# Patient Record
Sex: Female | Born: 1965 | Race: White | Hispanic: No | Marital: Married | Smoking: Never smoker
Health system: Southern US, Community
[De-identification: ages and names within clinical notes are randomized; demographics above are authoritative.]

## PROBLEM LIST (undated history)

## (undated) DIAGNOSIS — R928 Other abnormal and inconclusive findings on diagnostic imaging of breast: Secondary | ICD-10-CM

## (undated) DIAGNOSIS — R002 Palpitations: Secondary | ICD-10-CM

## (undated) DIAGNOSIS — F419 Anxiety disorder, unspecified: Secondary | ICD-10-CM

## (undated) DIAGNOSIS — N189 Chronic kidney disease, unspecified: Secondary | ICD-10-CM

## (undated) HISTORY — DX: Other abnormal and inconclusive findings on diagnostic imaging of breast: R92.8

## (undated) HISTORY — DX: Anxiety disorder, unspecified: F41.9

## (undated) HISTORY — PX: TONSILLECTOMY: SUR1361

## (undated) HISTORY — DX: Palpitations: R00.2

---

## 2001-09-26 HISTORY — PX: AUGMENTATION MAMMAPLASTY: SUR837

## 2004-08-11 ENCOUNTER — Ambulatory Visit: Payer: Self-pay

## 2006-01-10 ENCOUNTER — Ambulatory Visit: Payer: Self-pay

## 2007-01-12 ENCOUNTER — Ambulatory Visit: Payer: Self-pay

## 2007-01-16 ENCOUNTER — Ambulatory Visit: Payer: Self-pay | Admitting: Unknown Physician Specialty

## 2007-12-02 ENCOUNTER — Ambulatory Visit: Payer: Self-pay | Admitting: Family Medicine

## 2008-01-14 ENCOUNTER — Ambulatory Visit: Payer: Self-pay

## 2009-01-14 ENCOUNTER — Ambulatory Visit: Payer: Self-pay

## 2009-03-05 ENCOUNTER — Ambulatory Visit: Payer: Self-pay | Admitting: Internal Medicine

## 2009-03-25 ENCOUNTER — Ambulatory Visit: Payer: Self-pay | Admitting: Internal Medicine

## 2010-01-25 ENCOUNTER — Ambulatory Visit: Payer: Self-pay

## 2011-02-08 ENCOUNTER — Ambulatory Visit: Payer: Self-pay

## 2012-02-14 ENCOUNTER — Ambulatory Visit: Payer: Self-pay

## 2015-03-12 ENCOUNTER — Ambulatory Visit: Payer: Self-pay

## 2015-04-21 ENCOUNTER — Other Ambulatory Visit: Payer: Self-pay | Admitting: Certified Nurse Midwife

## 2015-04-21 DIAGNOSIS — R928 Other abnormal and inconclusive findings on diagnostic imaging of breast: Secondary | ICD-10-CM

## 2015-04-24 ENCOUNTER — Ambulatory Visit
Admission: RE | Admit: 2015-04-24 | Discharge: 2015-04-24 | Disposition: A | Discharge status: Home / Self Care | Payer: BC Managed Care – PPO | Source: Ambulatory Visit | Attending: Certified Nurse Midwife | Admitting: Certified Nurse Midwife

## 2015-04-24 DIAGNOSIS — N6001 Solitary cyst of right breast: Secondary | ICD-10-CM | POA: Insufficient documentation

## 2015-04-24 DIAGNOSIS — R928 Other abnormal and inconclusive findings on diagnostic imaging of breast: Secondary | ICD-10-CM

## 2015-04-27 ENCOUNTER — Ambulatory Visit: Payer: Self-pay

## 2015-04-27 ENCOUNTER — Other Ambulatory Visit: Payer: Self-pay

## 2015-05-11 ENCOUNTER — Encounter: Payer: Self-pay | Admitting: Urology

## 2015-05-11 ENCOUNTER — Ambulatory Visit (INDEPENDENT_AMBULATORY_CARE_PROVIDER_SITE_OTHER): Payer: BC Managed Care – PPO | Admitting: Urology

## 2015-05-11 VITALS — BP 121/78 | HR 71 | Ht 67.0 in | Wt 152.4 lb

## 2015-05-11 DIAGNOSIS — R310 Gross hematuria: Secondary | ICD-10-CM | POA: Diagnosis not present

## 2015-05-11 DIAGNOSIS — M545 Low back pain, unspecified: Secondary | ICD-10-CM

## 2015-05-11 LAB — MICROSCOPIC EXAMINATION: WBC, UA: NONE SEEN /hpf (ref 0–?)

## 2015-05-11 LAB — URINALYSIS, COMPLETE
BILIRUBIN UA: NEGATIVE
Glucose, UA: NEGATIVE
Ketones, UA: NEGATIVE
Leukocytes, UA: NEGATIVE
NITRITE UA: NEGATIVE
PH UA: 5.5 (ref 5.0–7.5)
PROTEIN UA: NEGATIVE
Specific Gravity, UA: >1.03 — ABNORMAL HIGH (ref 1.005–1.030)
UUROB: 0.2 mg/dL (ref 0.2–1.0)

## 2015-05-11 NOTE — Progress Notes (Signed)
05/11/2015 3:16 PM   Claire Ramos [birth year] [id number]  Referring provider: No referring provider defined for this encounter.  Chief Complaint  Patient presents with  . Hematuria    new pt- gross hematuria    HPI: Claire Ramos is a 49 year old white female who presents today complaining of gross hematuria.  Patient states about 2 months ago she had noticed bloody stringy discharge on her toilet tissue.   She has also been experiencing lower back pain. She states the pain comes and goes. She states that lasts more than half the days. It is worse in the a.m. but diminishes as the day goes on. She is taking 2 Advil at a time and it somewhat eases the pain.  She describes the pain as an knife pressing down into her back.   She has not had any fevers, chills, nausea or vomiting. She states she does have a history of kidney stones, but she never captured a fragment.   Her UA today contains 11-30 RBC's per high-power field. She has not experienced any more gross hematuria. She is also not experiencing any vaginal discharge or vaginal discomfort.   PMH: Past Medical History  Diagnosis Date  . Anxiety     Surgical History: Past Surgical History  Procedure Laterality Date  . Augmentation mammaplasty Bilateral 2003    Home Medications:    Medication List       This list is accurate as of: 05/11/15 11:59 PM.  Always use your most recent med list.               escitalopram 10 MG tablet  Commonly known as:  LEXAPRO  Take by mouth.     fluticasone 50 MCG/ACT nasal spray  Commonly known as:  FLONASE  Place into the nose.     ibuprofen 200 MG tablet  Commonly known as:  ADVIL,MOTRIN  Take 200 mg by mouth every 6 (six) hours as needed.        Allergies: No Known Allergies  Family History: Family History  Problem Relation Age of Onset  . Kidney cancer Neg Hx   . Kidney disease Neg Hx   . Prostate cancer Neg Hx     Social History:  reports that she has  never smoked. She does not have any smokeless tobacco history on file. She reports that she drinks alcohol. She reports that she does not use illicit drugs.  ROS: UROLOGY Frequent Urination?: No Hard to postpone urination?: No Burning/pain with urination?: No Get up at night to urinate?: No Leakage of urine?: Yes Urine stream starts and stops?: No Trouble starting stream?: No Do you have to strain to urinate?: No Blood in urine?: Yes Urinary tract infection?: No Sexually transmitted disease?: No Injury to kidneys or bladder?: No Painful intercourse?: No Weak stream?: No Currently pregnant?: No Vaginal bleeding?: No Last menstrual period?: n  Gastrointestinal Nausea?: No Vomiting?: No Indigestion/heartburn?: No Diarrhea?: No Constipation?: No  Constitutional Fever: No Night sweats?: No Weight loss?: No Fatigue?: No  Skin Skin rash/lesions?: No Itching?: No  Eyes Blurred vision?: No Double vision?: No  Ears/Nose/Throat Sore throat?: No Sinus problems?: No  Hematologic/Lymphatic Swollen glands?: No Easy bruising?: No  Cardiovascular Leg swelling?: No Chest pain?: No  Respiratory Cough?: No Shortness of breath?: No  Endocrine Excessive thirst?: No  Musculoskeletal Back pain?: Yes Joint pain?: No  Neurological Headaches?: Yes Dizziness?: No  Psychologic Depression?: No Anxiety?: Yes  Physical Exam: BP 121/78 mmHg  Pulse 71  Ht 5\' 7"  (1.702 m)  Wt 152 lb 6.4 oz (69.128 kg)  BMI 23.86 kg/m2  Constitutional:  Alert and oriented, No acute distress. HEENT: [place] AT, moist mucus membranes.  Trachea midline, no masses. Cardiovascular: No clubbing, cyanosis, or edema. Respiratory: Normal respiratory effort, no increased work of breathing. GI: Abdomen is soft, non tender, non distended, no abdominal masses GU: No CVA tenderness. Normal external genitalia.  Normal urethral meatus. No urethral masses and/or tenderness. Urethral hypermobility upon  straining, no leakage noted. No bladder fullness or masses. No vaginal lesions or discharge. Normal rectal tone, no masses. Normal anus and perineum.  Skin: No rashes, bruises or suspicious lesions. Lymph: No cervical or inguinal adenopathy. Neurologic: Grossly intact, no focal deficits, moving all 4 extremities. Psychiatric: Normal mood and affect.  Laboratory Data: Results for orders placed or performed in visit on 05/11/15  Microscopic Examination  Result Value Ref Range   WBC, UA None seen 0 -  5 /hpf   RBC, UA 11-30 (A) 0 -  2 /hpf   Epithelial Cells (non renal) >10E 0 - 10 /hpf   Bacteria, UA Few None seen/Few  Urinalysis, Complete  Result Value Ref Range   Specific Gravity, UA >1.030 (H) 1.005 - 1.030   pH, UA 5.5 5.0 - 7.5   Color, UA Yellow Yellow   Appearance Ur Clear Clear   Leukocytes, UA Negative Negative   Protein, UA Negative Negative/Trace   Glucose, UA Negative Negative   Ketones, UA Negative Negative   RBC, UA 3+ (A) Negative   Bilirubin, UA Negative Negative   Urobilinogen, Ur 0.2 0.2 - 1.0 mg/dL   Nitrite, UA Negative Negative   Microscopic Examination See below:     Pertinent Imaging:   Assessment & Plan:    1. Gross hematuria:   The patient described a bloody discharge which sounds vaginal in nature, but I could not demonstrate vaginal bleeding on her exam today and her UA was positive for microscopic hematuria.   Because of her persistent lower back pain, possible episode of gross hematuria and microhematuria on today's exam, I think it is reasonable to pursue a hematuria workup.   I explained to patient the causes of blood in the urine are as follows: stones,  UTI's, damage to the urinary tract and/or cancer.  It is explained to the patient that they will be scheduled for a CT Urogram with contrast material and that in rare instances, an allergic reaction can be serious and even life threatening with the injection of contrast material.   The patient  denies any allergies to contrast, iodine and/or seafood and is not taking metformin.  - Urinalysis, Complete   2. Acute low back pain:    Patient has been experiencing lower back pain for the last 2 months associated with hematuria. She will undergo a CT urogram for further evaluation. She will continue with conservative measures for her back pain at this time.  No Follow-up on file.  Zara Council, [facility] [address] [place], [place] [address] [phone]

## 2015-05-17 DIAGNOSIS — M545 Low back pain, unspecified: Secondary | ICD-10-CM | POA: Insufficient documentation

## 2015-05-17 DIAGNOSIS — R310 Gross hematuria: Secondary | ICD-10-CM | POA: Insufficient documentation

## 2015-06-04 ENCOUNTER — Ambulatory Visit: Admission: RE | Admit: 2015-06-04 | Payer: BC Managed Care – PPO | Source: Ambulatory Visit

## 2015-06-04 ENCOUNTER — Ambulatory Visit
Admission: RE | Admit: 2015-06-04 | Discharge: 2015-06-04 | Disposition: A | Discharge status: Home / Self Care | Payer: BC Managed Care – PPO | Source: Ambulatory Visit | Attending: Urology | Admitting: Urology

## 2015-06-04 DIAGNOSIS — N200 Calculus of kidney: Secondary | ICD-10-CM | POA: Diagnosis not present

## 2015-06-04 DIAGNOSIS — R310 Gross hematuria: Secondary | ICD-10-CM | POA: Diagnosis present

## 2015-06-04 MED ORDER — IOHEXOL 300 MG/ML  SOLN
125.0000 mL | Freq: Once | INTRAMUSCULAR | Status: AC | PRN
  Administered 2015-06-04: 125 mL via INTRAVENOUS

## 2015-06-08 ENCOUNTER — Ambulatory Visit: Payer: BC Managed Care – PPO | Admitting: Urology

## 2015-06-08 ENCOUNTER — Ambulatory Visit (INDEPENDENT_AMBULATORY_CARE_PROVIDER_SITE_OTHER): Payer: BC Managed Care – PPO | Admitting: Urology

## 2015-06-08 VITALS — BP 121/80 | HR 75 | Ht 67.0 in | Wt 151.8 lb

## 2015-06-08 DIAGNOSIS — R310 Gross hematuria: Secondary | ICD-10-CM | POA: Diagnosis not present

## 2015-06-08 LAB — URINALYSIS, COMPLETE
Bilirubin, UA: NEGATIVE
GLUCOSE, UA: NEGATIVE
KETONES UA: NEGATIVE
LEUKOCYTES UA: NEGATIVE
Nitrite, UA: NEGATIVE
Protein, UA: NEGATIVE
SPEC GRAV UA: 1.02 (ref 1.005–1.030)
Urobilinogen, Ur: 0.2 mg/dL (ref 0.2–1.0)
pH, UA: 5.5 (ref 5.0–7.5)

## 2015-06-08 LAB — MICROSCOPIC EXAMINATION: BACTERIA UA: NONE SEEN

## 2015-06-08 NOTE — Progress Notes (Addendum)
06/08/2015 9:44 PM   Claire Ramos 09-14-1966 [id number]  Referring provider: No referring provider defined for this encounter.  Chief Complaint  Patient presents with  . Results    CT    HPI: Patient is a 49 year old white female with a history of gross hematuria and lower back pain who underwent CT urogram for further evaluation of the hematuria and back pain.  I have reviewed the film with the patient. It demonstrated a nonobstructing 4 mm inter-polar left renal stone.  Unfortunately, there was not complete opacification of the right ureter on the imaging study.  I explained to the patient the significance of this finding.  Today, she is still experiencing urinary urgency  and stress urinary incontinence.  These are long time symptoms to her and not bothersome at this time. She has not had any further gross hematuria.  She still has 3-10 RBC's per high-power field.  She does not have a smoking history.  She has no family history of GU malignancies.  PMH: Past Medical History  Diagnosis Date  . Anxiety     Surgical History: Past Surgical History  Procedure Laterality Date  . Augmentation mammaplasty Bilateral 2003    Home Medications:    Medication List       This list is accurate as of: 06/08/15 11:59 PM.  Always use your most recent med list.               escitalopram 10 MG tablet  Commonly known as:  LEXAPRO  Take by mouth.     fluticasone 50 MCG/ACT nasal spray  Commonly known as:  FLONASE  Place into the nose.     ibuprofen 200 MG tablet  Commonly known as:  ADVIL,MOTRIN  Take 200 mg by mouth every 6 (six) hours as needed.        Allergies: No Known Allergies  Family History: Family History  Problem Relation Age of Onset  . Kidney cancer Neg Hx   . Kidney disease Neg Hx   . Prostate cancer Neg Hx     Social History:  reports that she has never smoked. She does not have any smokeless tobacco history on file. She reports that she drinks  alcohol. She reports that she does not use illicit drugs.  ROS: UROLOGY Frequent Urination?: No Hard to postpone urination?: Yes Burning/pain with urination?: No Get up at night to urinate?: No Leakage of urine?: Yes Urine stream starts and stops?: No Trouble starting stream?: No Do you have to strain to urinate?: No Blood in urine?: No Urinary tract infection?: No Sexually transmitted disease?: No Injury to kidneys or bladder?: No Painful intercourse?: No Weak stream?: No Currently pregnant?: No Vaginal bleeding?: No Last menstrual period?: IUD  Gastrointestinal Nausea?: No Vomiting?: No Indigestion/heartburn?: No Diarrhea?: No Constipation?: No  Constitutional Fever: No Night sweats?: No Weight loss?: No Fatigue?: No  Skin Skin rash/lesions?: No Itching?: No  Eyes Blurred vision?: No Double vision?: No  Ears/Nose/Throat Sore throat?: No Sinus problems?: No  Hematologic/Lymphatic Swollen glands?: No Easy bruising?: No  Cardiovascular Leg swelling?: No Chest pain?: No  Respiratory Cough?: No Shortness of breath?: No  Endocrine Excessive thirst?: No  Musculoskeletal Back pain?: Yes Joint pain?: No  Neurological Headaches?: No Dizziness?: No  Psychologic Depression?: No Anxiety?: No  Physical Exam: BP 121/80 mmHg  Pulse 75  Ht 5\' 7"  (1.702 m)  Wt 151 lb 12.8 oz (68.856 kg)  BMI 23.77 kg/m2  LMP  (LMP Unknown)  Constitutional:  Alert and oriented, No acute distress. HEENT: [place] AT, moist mucus membranes.  Trachea midline, no masses. Cardiovascular: No clubbing, cyanosis, or edema. Respiratory: Normal respiratory effort, no increased work of breathing. GI: Abdomen is soft, nontender, nondistended, no abdominal masses GU: No CVA tenderness.  Skin: No rashes, bruises or suspicious lesions. Lymph: No cervical or inguinal adenopathy. Neurologic: Grossly intact, no focal deficits, moving all 4 extremities. Psychiatric: Normal mood and  affect.  Laboratory Data: Urinalysis: Results for orders placed or performed in visit on 06/08/15  CULTURE, URINE COMPREHENSIVE  Result Value Ref Range   Urine Culture, Comprehensive Final report    Result 1 Comment   Microscopic Examination  Result Value Ref Range   WBC, UA 0-5 0 -  5 /hpf   RBC, UA 3-10 (A) 0 -  2 /hpf   Epithelial Cells (non renal) 0-10 0 - 10 /hpf   Mucus, UA Present (A) Not Estab.   Bacteria, UA None seen None seen/Few  Urinalysis, Complete  Result Value Ref Range   Specific Gravity, UA 1.020 1.005 - 1.030   pH, UA 5.5 5.0 - 7.5   Color, UA Yellow Yellow   Appearance Ur Clear Clear   Leukocytes, UA Negative Negative   Protein, UA Negative Negative/Trace   Glucose, UA Negative Negative   Ketones, UA Negative Negative   RBC, UA 3+ (A) Negative   Bilirubin, UA Negative Negative   Urobilinogen, Ur 0.2 0.2 - 1.0 mg/dL   Nitrite, UA Negative Negative   Microscopic Examination See below:     Pertinent Imaging: CLINICAL DATA: 49 year old female with gross hematuria and low back pain since June 2016.  EXAM: CT ABDOMEN AND PELVIS WITHOUT AND WITH CONTRAST  TECHNIQUE: Multidetector CT imaging of the abdomen and pelvis was performed following the standard protocol before and following the bolus administration of intravenous contrast.  CONTRAST: 135mL OMNIPAQUE IOHEXOL 300 MG/ML SOLN  COMPARISON: 03/05/2009 abdominal sonogram. Report from 03/25/2009 CT abdomen/pelvis (images not available).  FINDINGS: Lower chest: No significant pulmonary nodules or acute consolidative airspace disease at the visualized lung bases.  Hepatobiliary: Normal liver, with no liver mass. Normal gallbladder, with no radiopaque cholelithiasis. No biliary ductal dilatation.  Pancreas: Normal.  Spleen: Normal.  Adrenals/Urinary Tract: Normal adrenals. Nonobstructing 4 mm interpolar left renal stone. No additional renal stones. No hydronephrosis. Normal size  kidneys with symmetric contrast nephrogram is a no renal masses. Normal caliber ureters, with no ureteral stones. On delayed imaging, there is no urothelial wall thickening and there are no filling defects in the opacified portions of the bilateral collecting systems or ureters. Normal urinary bladder, with no bladder wall thickening, bladder masses, bladder stones or bladder diverticula.  Stomach/Bowel: Normal stomach. Normal caliber small bowel, with no small bowel wall thickening. Normal appendix. Normal large bowel, with no large bowel wall thickening and no colonic diverticulosis.  Vascular/Lymphatic: Normal caliber abdominal aorta. Patent portal, splenic, hepatic and renal veins. No abdominopelvic lymphadenopathy.  Reproductive: Normal anteverted uterus with grossly well-positioned intrauterine device in the endometrial cavity. Simple 1.3 cm right ovarian cyst, in keeping with a physiologic right ovarian follicle. No suspicious adnexal masses.  Other: No pneumoperitoneum, ascites or focal fluid collection.  Musculoskeletal: Limbus vertebra in the anterior superior L4 vertebral body. No aggressive appearing focal osseous lesions.  IMPRESSION: 1. Nonobstructing 4 mm interpolar left renal stone. 2. Otherwise normal urinary tract, with no renal masses and no urothelial lesions.   Electronically Signed  By: Ilona Sorrel M.D.  On: 06/04/2015 09:02  Assessment & Plan:    1. Gross hematuria:  Patient has completed her CT urogram.  A nonobstructing 4 mm interpolar left renal stone was discovered.  She is no longer experiencing gross hematuria, but continues to have microscopic hematuria.  She also continues to have lower back pain.  Her right ureter did not completely opacify on the CT urogram.  This causes some concern for an undiscovered ureteral lesion.  She will undergo cystoscopy with bilateral retrogrades in the OR to complete her CT urogram workup.  I described to  the patient how the procedure is performed and the risk associated with the procedure, such as: Infection, bleeding, uncomfortableness for the first few days after the procedure, the possibility of a biopsy of an area of concern in the ureters or bladder and the possibility of a stent placement.  I also explained the risks of general anesthesia, such as: MI, CVA, paralysis, coma and/or death.  I have discussed the case with Dr. Junious Silk and he agrees with the plan and will be performing the procedure.  2. Incomplete opacification of the right ureter on CT Urogram:   Proceed with cystoscopy with bilateral retrogrades.     Return for cystoscopy with bilateral retrogrades.  Zara Council, [facility] [address] [place], [place] [address] [phone]

## 2015-06-10 ENCOUNTER — Encounter: Payer: Self-pay | Admitting: Urology

## 2015-06-10 ENCOUNTER — Other Ambulatory Visit: Payer: BC Managed Care – PPO

## 2015-06-10 ENCOUNTER — Telehealth: Payer: Self-pay | Admitting: Radiology

## 2015-06-10 LAB — CULTURE, URINE COMPREHENSIVE

## 2015-06-10 NOTE — Telephone Encounter (Signed)
Notified pt of surgery date of 06/15/15 and pre-admit phone interview of 06/10/15. Pt voiced understanding.

## 2015-06-11 ENCOUNTER — Encounter
Admission: RE | Admit: 2015-06-11 | Discharge: 2015-06-11 | Disposition: A | Discharge status: Home / Self Care | Payer: BC Managed Care – PPO | Source: Ambulatory Visit | Attending: Urology | Admitting: Urology

## 2015-06-11 ENCOUNTER — Encounter: Payer: Self-pay | Admitting: *Deleted

## 2015-06-11 DIAGNOSIS — Z01812 Encounter for preprocedural laboratory examination: Secondary | ICD-10-CM | POA: Insufficient documentation

## 2015-06-11 LAB — BASIC METABOLIC PANEL
ANION GAP: 7 (ref 5–15)
BUN: 19 mg/dL (ref 6–20)
CALCIUM: 8.9 mg/dL (ref 8.9–10.3)
CO2: 29 mmol/L (ref 22–32)
Chloride: 102 mmol/L (ref 101–111)
Creatinine, Ser: 0.79 mg/dL (ref 0.44–1.00)
GFR calc Af Amer: >60 mL/min (ref >60)
Glucose, Bld: 75 mg/dL (ref 65–99)
POTASSIUM: 3.9 mmol/L (ref 3.5–5.1)
SODIUM: 138 mmol/L (ref 135–145)

## 2015-06-11 LAB — CBC
HEMATOCRIT: 40.6 % (ref 35.0–47.0)
HEMOGLOBIN: 13.1 g/dL (ref 12.0–16.0)
MCH: 27 pg (ref 26.0–34.0)
MCHC: 32.3 g/dL (ref 32.0–36.0)
MCV: 83.5 fL (ref 80.0–100.0)
Platelets: 292 10*3/uL (ref 150–440)
RBC: 4.86 MIL/uL (ref 3.80–5.20)
RDW: 13.9 % (ref 11.5–14.5)
WBC: 8.4 10*3/uL (ref 3.6–11.0)

## 2015-06-11 NOTE — Patient Instructions (Signed)
  Your procedure is scheduled on: 06-15-15 Report to [facility] To find out your arrival time please call [phone] between 1PM - 3PM 06-12-15  Remember: Instructions that are not followed completely may result in serious medical risk, up to and including death, or upon the discretion of your surgeon and anesthesiologist your surgery may need to be rescheduled.    _X___ 1. Do not eat food or drink liquids after midnight. No gum chewing or hard candies.     _X___ 2. No Alcohol for 24 hours before or after surgery.   ____ 3. Bring all medications with you on the day of surgery if instructed.    _X___ 4. Notify your doctor if there is any change in your medical condition     (cold, fever, infections).     Do not wear jewelry, make-up, hairpins, clips or nail polish.  Do not wear lotions, powders, or perfumes. You may wear deodorant.  Do not shave 48 hours prior to surgery. Men may shave face and neck.  Do not bring valuables to the hospital.    [facility] is not responsible for any belongings or valuables.               Contacts, dentures or bridgework may not be worn into surgery.  Leave your suitcase in the car. After surgery it may be brought to your room.  For patients admitted to the hospital, discharge time is determined by your  treatment team.   Patients discharged the day of surgery will not be allowed to drive home.   Please read over the following fact sheets that you were given:   ____ Take these medicines the morning of surgery with A SIP OF WATER:    1. NONE  2.   3.   4.  5.  6.  ____ Fleet Enema (as directed)   ____ Use CHG Soap as directed  ____ Use inhalers on the day of surgery  ____ Stop metformin 2 days prior to surgery    ____ Take 1/2 of usual insulin dose the night before surgery and none on the morning of surgery.   ____ Stop Coumadin/Plavix/aspirin-N/A  __X__ Stop Anti-inflammatories-STOP IBUPROFEN NOW-NO NSAIDS  OR ASPIRIN PRODUCTS-TYLENOL OK   ____ Stop supplements until after surgery.    ____ Bring C-Pap to the hospital.

## 2015-06-15 ENCOUNTER — Ambulatory Visit: Payer: BC Managed Care – PPO | Admitting: Certified Registered Nurse Anesthetist

## 2015-06-15 ENCOUNTER — Encounter: Payer: Self-pay | Admitting: *Deleted

## 2015-06-15 ENCOUNTER — Encounter
Admission: RE | Disposition: A | Discharge status: Home / Self Care | Payer: Self-pay | Source: Ambulatory Visit | Attending: Urology

## 2015-06-15 ENCOUNTER — Ambulatory Visit
Admission: RE | Admit: 2015-06-15 | Discharge: 2015-06-15 | Disposition: A | Discharge status: Home / Self Care | Payer: BC Managed Care – PPO | Source: Ambulatory Visit | Attending: Urology | Admitting: Urology

## 2015-06-15 DIAGNOSIS — Z7951 Long term (current) use of inhaled steroids: Secondary | ICD-10-CM | POA: Insufficient documentation

## 2015-06-15 DIAGNOSIS — Z87891 Personal history of nicotine dependence: Secondary | ICD-10-CM | POA: Insufficient documentation

## 2015-06-15 DIAGNOSIS — R310 Gross hematuria: Secondary | ICD-10-CM | POA: Diagnosis not present

## 2015-06-15 DIAGNOSIS — Z8042 Family history of malignant neoplasm of prostate: Secondary | ICD-10-CM | POA: Insufficient documentation

## 2015-06-15 DIAGNOSIS — F419 Anxiety disorder, unspecified: Secondary | ICD-10-CM | POA: Diagnosis not present

## 2015-06-15 DIAGNOSIS — R312 Other microscopic hematuria: Secondary | ICD-10-CM | POA: Diagnosis present

## 2015-06-15 DIAGNOSIS — Z791 Long term (current) use of non-steroidal anti-inflammatories (NSAID): Secondary | ICD-10-CM | POA: Insufficient documentation

## 2015-06-15 DIAGNOSIS — Z8051 Family history of malignant neoplasm of kidney: Secondary | ICD-10-CM | POA: Diagnosis not present

## 2015-06-15 DIAGNOSIS — N358 Other urethral stricture: Secondary | ICD-10-CM | POA: Diagnosis not present

## 2015-06-15 DIAGNOSIS — Z87442 Personal history of urinary calculi: Secondary | ICD-10-CM | POA: Diagnosis not present

## 2015-06-15 DIAGNOSIS — N189 Chronic kidney disease, unspecified: Secondary | ICD-10-CM | POA: Diagnosis not present

## 2015-06-15 DIAGNOSIS — N359 Urethral stricture, unspecified: Secondary | ICD-10-CM | POA: Insufficient documentation

## 2015-06-15 DIAGNOSIS — Z79899 Other long term (current) drug therapy: Secondary | ICD-10-CM | POA: Insufficient documentation

## 2015-06-15 DIAGNOSIS — Z9889 Other specified postprocedural states: Secondary | ICD-10-CM | POA: Insufficient documentation

## 2015-06-15 HISTORY — DX: Chronic kidney disease, unspecified: N18.9

## 2015-06-15 HISTORY — PX: CYSTOSCOPY W/ RETROGRADES: SHX1426

## 2015-06-15 LAB — POCT PREGNANCY, URINE: PREG TEST UR: NEGATIVE

## 2015-06-15 SURGERY — CYSTOSCOPY, WITH RETROGRADE PYELOGRAM
Anesthesia: General | Site: Bladder | Laterality: Bilateral | Wound class: Clean Contaminated

## 2015-06-15 MED ORDER — UROGESIC-BLUE 81.6 MG PO TABS: 1.0000 | ORAL_TABLET | Freq: Four times a day (QID) | ORAL | Status: DC | PRN

## 2015-06-15 MED ORDER — DEXAMETHASONE SODIUM PHOSPHATE 4 MG/ML IJ SOLN
INTRAMUSCULAR | Status: DC | PRN
  Administered 2015-06-15: 5 mg via INTRAVENOUS

## 2015-06-15 MED ORDER — PROPOFOL 10 MG/ML IV BOLUS
INTRAVENOUS | Status: DC | PRN
  Administered 2015-06-15: 180 mg via INTRAVENOUS

## 2015-06-15 MED ORDER — LIDOCAINE HCL (CARDIAC) 20 MG/ML IV SOLN
INTRAVENOUS | Status: DC | PRN
  Administered 2015-06-15: 60 mg via INTRAVENOUS

## 2015-06-15 MED ORDER — FENTANYL CITRATE (PF) 100 MCG/2ML IJ SOLN
INTRAMUSCULAR | Status: DC | PRN
  Administered 2015-06-15 (×2): 25 ug via INTRAVENOUS

## 2015-06-15 MED ORDER — CEFAZOLIN SODIUM 1-5 GM-% IV SOLN
INTRAVENOUS | Status: AC
  Administered 2015-06-15: 1 g via INTRAVENOUS
  Filled 2015-06-15: qty 50

## 2015-06-15 MED ORDER — HYDROMORPHONE HCL 1 MG/ML IJ SOLN: 0.2500 mg | INTRAMUSCULAR | Status: DC | PRN

## 2015-06-15 MED ORDER — LACTATED RINGERS IV SOLN
INTRAVENOUS | Status: DC
  Administered 2015-06-15: 07:00:00 via INTRAVENOUS

## 2015-06-15 MED ORDER — IOTHALAMATE MEGLUMINE 60 % INJ SOLN
INTRAMUSCULAR | Status: DC | PRN
Start: 2015-06-15 — End: 2015-06-15
  Administered 2015-06-15: 25 mL

## 2015-06-15 MED ORDER — DEXAMETHASONE SODIUM PHOSPHATE 4 MG/ML IJ SOLN
8.0000 mg | Freq: Once | INTRAMUSCULAR | Status: DC | PRN
  Filled 2015-06-15: qty 2

## 2015-06-15 MED ORDER — ONDANSETRON HCL 4 MG/2ML IJ SOLN
INTRAMUSCULAR | Status: DC | PRN
  Administered 2015-06-15: 4 mg via INTRAVENOUS

## 2015-06-15 MED ORDER — FAMOTIDINE 20 MG PO TABS
20.0000 mg | ORAL_TABLET | Freq: Once | ORAL | Status: AC
  Administered 2015-06-15: 20 mg via ORAL

## 2015-06-15 MED ORDER — MIDAZOLAM HCL 2 MG/2ML IJ SOLN
INTRAMUSCULAR | Status: DC | PRN
  Administered 2015-06-15: 2 mg via INTRAVENOUS

## 2015-06-15 MED ORDER — PHENAZOPYRIDINE HCL 200 MG PO TABS: 200.0000 mg | ORAL_TABLET | Freq: Three times a day (TID) | ORAL | Status: DC | PRN

## 2015-06-15 MED ORDER — FAMOTIDINE 20 MG PO TABS
ORAL_TABLET | ORAL | Status: AC
  Administered 2015-06-15: 20 mg via ORAL
  Filled 2015-06-15: qty 1

## 2015-06-15 MED ORDER — LIDOCAINE HCL 2 % EX GEL
CUTANEOUS | Status: AC
  Filled 2015-06-15: qty 10

## 2015-06-15 MED ORDER — SODIUM CHLORIDE 0.9 % IR SOLN
Status: DC | PRN
  Administered 2015-06-15: 300 mL

## 2015-06-15 MED ORDER — CEFAZOLIN SODIUM 1-5 GM-% IV SOLN
1.0000 g | Freq: Once | INTRAVENOUS | Status: AC
  Administered 2015-06-15: 1 g via INTRAVENOUS

## 2015-06-15 MED ORDER — LIDOCAINE HCL 2 % EX GEL
CUTANEOUS | Status: DC | PRN
  Administered 2015-06-15: 1

## 2015-06-15 SURGICAL SUPPLY — 20 items
BAG DRAIN CYSTO-URO LG1000N (MISCELLANEOUS) ×3 IMPLANT
CATH FOL LX CONE TIP  8F (CATHETERS) ×2
CATH FOL LX CONE TIP 8F (CATHETERS) ×1 IMPLANT
CATH URETL 5X70 OPEN END (CATHETERS) ×3 IMPLANT
CATH URETL OPEN END 6X70 (CATHETERS) ×3 IMPLANT
CONRAY 43 FOR UROLOGY 50M (MISCELLANEOUS) ×3 IMPLANT
GLOVE BIO SURGEON STRL SZ7.5 (GLOVE) ×3 IMPLANT
GLOVE BIO SURGEON STRL SZ8 (GLOVE) ×3 IMPLANT
GOWN STRL REUS W/ TWL LRG LVL3 (GOWN DISPOSABLE) ×1 IMPLANT
GOWN STRL REUS W/ TWL XL LVL3 (GOWN DISPOSABLE) ×1 IMPLANT
GOWN STRL REUS W/TWL LRG LVL3 (GOWN DISPOSABLE) ×2
GOWN STRL REUS W/TWL XL LVL3 (GOWN DISPOSABLE) ×2
GUIDEWIRE STR ZIPWIRE 035X150 (MISCELLANEOUS) ×3 IMPLANT
NS IRRIG 500ML POUR BTL (IV SOLUTION) ×3 IMPLANT
PACK CYSTO AR (MISCELLANEOUS) ×3 IMPLANT
PREP PVP WINGED SPONGE (MISCELLANEOUS) ×3 IMPLANT
SET CYSTO W/LG BORE CLAMP LF (SET/KITS/TRAYS/PACK) ×3 IMPLANT
SOL .9 NS 3000ML IRR  AL (IV SOLUTION) ×2
SOL .9 NS 3000ML IRR UROMATIC (IV SOLUTION) ×1 IMPLANT
WATER STERILE IRR 1000ML POUR (IV SOLUTION) ×3 IMPLANT

## 2015-06-15 NOTE — H&P (Signed)
H&P  Chief Complaint: microhematuria  History of Present Illness: 49 yo smoker with microhematuria. Needs cystoscopy and bilateral RGP to eval GU tract. She is well today. No fever, dysuria, gross hematuria.   Past Medical History  Diagnosis Date  . Anxiety   . Chronic kidney disease     KIDNEY STONE   Past Surgical History  Procedure Laterality Date  . Augmentation mammaplasty Bilateral 2003  . Tonsillectomy      Home Medications:  Prescriptions prior to admission  Medication Sig Dispense Refill Last Dose  . escitalopram (LEXAPRO) 10 MG tablet Take 5 mg by mouth at bedtime.    06/13/2015  . ibuprofen (ADVIL,MOTRIN) 200 MG tablet Take 200 mg by mouth every 6 (six) hours as needed.   06/08/2015  . Levonorgestrel (MIRENA, 52 MG, IU) 52 mg by Intrauterine route.     . fluticasone (FLONASE) 50 MCG/ACT nasal spray Place into the nose.   Not Taking   Allergies: No Known Allergies  Family History  Problem Relation Age of Onset  . Kidney cancer Neg Hx   . Kidney disease Neg Hx   . Prostate cancer Neg Hx    Social History:  reports that she has never smoked. She does not have any smokeless tobacco history on file. She reports that she drinks alcohol. She reports that she does not use illicit drugs.  ROS: A complete review of systems was performed.  All systems are negative except for pertinent findings as noted. ROS   Physical Exam:  Vital signs in last 24 hours: Temp:  [98 F (36.7 C)] 98 F (36.7 C) (09/19 0626) Pulse Rate:  [72] 72 (09/19 0626) Resp:  [16] 16 (09/19 0626) BP: (113)/(87) 113/87 mmHg (09/19 0626) SpO2:  [100 %] 100 % (09/19 0626) General:  Alert and oriented, No acute distress HEENT: Normocephalic, atraumatic Neck: No JVD or lymphadenopathy Cardiovascular: Regular rate and rhythm Lungs: Regular rate and effort Abdomen: Soft, nontender, nondistended, no abdominal masses Back: No CVA tenderness Extremities: No edema Neurologic: Grossly  intact  Laboratory Data:  Results for orders placed or performed during the hospital encounter of 06/15/15 (from the past 24 hour(s))  Pregnancy, urine POC     Status: None   Collection Time: 06/15/15  6:24 AM  Result Value Ref Range   Preg Test, Ur NEGATIVE NEGATIVE   Recent Results (from the past 240 hour(s))  CULTURE, URINE COMPREHENSIVE     Status: None   Collection Time: 06/08/15  4:02 PM  Result Value Ref Range Status   Urine Culture, Comprehensive Final report  Final   Result 1 Comment  Final    Comment: Mixed urogenital flora 25,000-50,000 colony forming units per mL   Microscopic Examination     Status: Abnormal   Collection Time: 06/08/15  4:02 PM  Result Value Ref Range Status   WBC, UA 0-5 0 -  5 /hpf Final   RBC, UA 3-10 (A) 0 -  2 /hpf Final   Epithelial Cells (non renal) 0-10 0 - 10 /hpf Final   Mucus, UA Present (A) Not Estab. Final   Bacteria, UA None seen None seen/Few Final   Creatinine:  Recent Labs  06/11/15 1530  CREATININE 0.79    Impression/Assessment/plan: Discussed with patient and family nature, potential benefits, risks and alternatives to cystoscopy, bladder biopsy, fulguration, bilateral RGP, poss bbx, poss URS/stents including side effects of the proposed treatment, the likelihood of the patient achieving the goals of the procedure, and any potential problems  that might occur during the procedure or recuperation. All questions answered. Patient elects to proceed.    ESKRIDGE, MATTHEW 06/15/2015, 7:27 AM

## 2015-06-15 NOTE — Anesthesia Preprocedure Evaluation (Signed)
Anesthesia Evaluation  Patient identified by MRN, date of birth, ID band Patient awake    Reviewed: Allergy & Precautions, NPO status , Patient's Chart, lab work & pertinent test results  Airway Mallampati: I  TM Distance: >3 FB Neck ROM: Full    Dental  (+) Teeth Intact   Pulmonary    Pulmonary exam normal        Cardiovascular Exercise Tolerance: Good negative cardio ROS Normal cardiovascular exam     Neuro/Psych    GI/Hepatic   Endo/Other    Renal/GU Renal diseaseKidney stone and hematuria.     Musculoskeletal   Abdominal (+)  Abdomen: soft.    Peds  Hematology   Anesthesia Other Findings   Reproductive/Obstetrics                             Anesthesia Physical Anesthesia Plan  ASA: II  Anesthesia Plan: General   Post-op Pain Management:    Induction: Intravenous  Airway Management Planned: LMA  Additional Equipment:   Intra-op Plan:   Post-operative Plan: Extubation in OR  Informed Consent: I have reviewed the patients History and Physical, chart, labs and discussed the procedure including the risks, benefits and alternatives for the proposed anesthesia with the patient or authorized representative who has indicated his/her understanding and acceptance.     Plan Discussed with:   Anesthesia Plan Comments:         Anesthesia Quick Evaluation

## 2015-06-15 NOTE — Anesthesia Postprocedure Evaluation (Signed)
  Anesthesia Post-op Note  Patient: Claire Ramos  Procedure(s) Performed: Procedure(s): CYSTOSCOPY WITH RETROGRADE PYELOGRAM (Bilateral)  Anesthesia type:General  Patient location: PACU  Post pain: Pain level controlled  Post assessment: Post-op Vital signs reviewed, Patient's Cardiovascular Status Stable, Respiratory Function Stable, Patent Airway and No signs of Nausea or vomiting  Post vital signs: Reviewed and stable  Last Vitals:  Filed Vitals:   06/15/15 0910  BP: 124/64  Pulse: 58  Temp: 36.4 C  Resp: 18    Level of consciousness: awake, alert  and patient cooperative  Complications: No apparent anesthesia complications

## 2015-06-15 NOTE — Progress Notes (Signed)
I discussed the operative findings with patient and her family (mom, husband). Preoperatively patient had incontinence when she laughed, sneezed or went for a run. She had pain in her low back.

## 2015-06-15 NOTE — Op Note (Signed)
Preoperative diagnosis: Gross hematuria Postoperative diagnosis: Urethral stricture, gross hematuria  Procedure: Exam under anesthesia, dilation of urethra, cystoscopy, bilateral retrograde pyelogram  Surgeon: Junious Silk  Anesthesia: Gen.  Indication for procedure: 49 year old female with pelvic pain and gross hematuria. History of smoking. Brought for evaluation of urinary tract.  Findings: On exam under anesthesia the vulva appeared normal without mass or lesion. The meatus appeared normal. The bladder and urethra were palpably normal on bimanual exam. There were no suprapubic masses. Cervix palpably normal.  On cystoscopy the urethra was tight. He required dilation to 63 [place] to get the 22 [place] cystoscope replaced. There was a distal large caliber stricture. I did not meet significant resistance until I got to about 54 [place]. On cystoscopy urethra os could be the urethra appeared normal with out mass or lesion. The trigone and ureteral orifices were in their normal orthotopic position. There was clear efflux. There were no stones or foreign bodies in the bladder. The bladder mucosa was smooth without mass, lesion or erythema.  Scout imaging of the abdomen appeared normal.  Left retrograde pyelogram-this outlined a single ureter single collecting system unit without filling defect, stricture or dilation. There was brisk drainage.  Right retrograde pyelogram-this outlined a single ureter single collecting system unit without filling defect, stricture or dilation. There was brisk drainage.  Description of procedure: After consent was obtained the patient was brought to the operating room. After adequate anesthesia she was placed in lithotomy position and prepped and draped in the usual sterile fashion. A timeout was performed to confirm the patient and procedure. An exam under anesthesia was performed. I tried to insert the 80 French cystoscope and a met resistance close to the meatus. I put  the obturator and the scope to smooth out the end but the sheath still would not pass. Therefore the urethra was dilated from 12 to 43 [place]. I did not meet significant resistance until about 96 [place]. 18 through 37 [place] provided some dilation. The cystoscope then passed easily. There were no specific lesions of the urethra. Otherwise cystoscopy was unremarkable. The left ureteral orifice was cannulated with with a cone-tipped catheter. Retrograde injection of contrast was performed. The system drained briskly. The right ureteral orifice was cannulated and retrograde injection of contrast was performed. This side drained briskly to. Scout and spot fluoroscopic images were obtained. Some images were saved but did not capture the entire study. The bladder was drained and the scope was removed. I infiltrated the urethra was some lidocaine jelly. She was awakened and taken to the recovery room in stable condition.   Complications: None  Blood loss: Minimal  Specimens: None  Drains: None  Disposition: Patient stable to PACU

## 2015-06-15 NOTE — Discharge Instructions (Signed)
Cystoscopy, Care After °Refer to this sheet in the next few weeks. These instructions provide you with information on caring for yourself after your procedure. Your caregiver may also give you more specific instructions. Your treatment has been planned according to current medical practices, but problems sometimes occur. Call your caregiver if you have any problems or questions after your procedure. °HOME CARE INSTRUCTIONS  °Things you can do to ease any discomfort after your procedure include: °· Drinking enough water and fluids to keep your urine clear or pale yellow. °· Taking a warm bath to relieve any burning feelings. °SEEK IMMEDIATE MEDICAL CARE IF:  °· You have an increase in blood in your urine. °· You notice blood clots in your urine. °· You have difficulty passing urine. °· You have the chills. °· You have abdominal pain. °· You have a fever or persistent symptoms for more than 2-3 days. °· You have a fever and your symptoms suddenly get worse. °MAKE SURE YOU:  °· Understand these instructions. °· Will watch your condition. °· Will get help right away if you are not doing well or get worse. °Document Released: 04/01/2005 Document Revised: 05/15/2013 Document Reviewed: 03/05/2012 °ExitCare® Patient Information ©2015 [employer]. This information is not intended to replace advice given to you by your health care provider. Make sure you discuss any questions you have with your health care provider. ° °AMBULATORY SURGERY  °DISCHARGE INSTRUCTIONS ° °1) The drugs that you were given will stay in your system until tomorrow so for the next 24 hours you should not: °A) Drive an automobile °B) Make any legal decisions °C) Drink any alcoholic beverage ° °2) You may resume regular meals tomorrow.  Today it is better to start with liquids and gradually work up to solid foods. ° °You may eat anything you prefer, but it is better to start with liquids, then soup and crackers, and gradually work up to solid  foods. ° °3) Please notify your doctor immediately if you have any unusual bleeding, trouble breathing, redness and pain at the surgery site, drainage, fever, or pain not relieved by medication. ° °4) Additional Instructions:  ° °Please contact your physician with any problems or Same Day Surgery at [phone], Monday through Friday 6 am to 4 pm, or [place] [facility] [place] [facility] number at [phone]. °

## 2015-06-15 NOTE — Transfer of Care (Signed)
Immediate Anesthesia Transfer of Care Note  Patient: Claire Ramos  Procedure(s) Performed: Procedure(s): CYSTOSCOPY WITH RETROGRADE PYELOGRAM (Bilateral)  Patient Location: PACU  Anesthesia Type:General  Level of Consciousness: sedated  Airway & Oxygen Therapy: Patient Spontanous Breathing and Patient connected to face mask oxygen  Post-op Assessment: Report given to RN and Post -op Vital signs reviewed and stable  Post vital signs: Reviewed and stable  Last Vitals:  Filed Vitals:   06/15/15 0626  BP: 113/87  Pulse: 72  Temp: 36.7 C  Resp: 16    Complications: No apparent anesthesia complications

## 2015-06-16 ENCOUNTER — Encounter: Payer: Self-pay | Admitting: Urology

## 2015-07-28 ENCOUNTER — Ambulatory Visit: Payer: BC Managed Care – PPO | Admitting: Urology

## 2015-07-28 ENCOUNTER — Encounter: Payer: Self-pay | Admitting: Urology

## 2016-03-25 ENCOUNTER — Other Ambulatory Visit: Payer: Self-pay | Admitting: Certified Nurse Midwife

## 2016-03-25 DIAGNOSIS — Z1231 Encounter for screening mammogram for malignant neoplasm of breast: Secondary | ICD-10-CM

## 2016-04-21 ENCOUNTER — Other Ambulatory Visit: Payer: Self-pay | Admitting: Certified Nurse Midwife

## 2016-04-21 ENCOUNTER — Ambulatory Visit
Admission: RE | Admit: 2016-04-21 | Discharge: 2016-04-21 | Disposition: A | Discharge status: Home / Self Care | Payer: BC Managed Care – PPO | Source: Ambulatory Visit | Attending: Certified Nurse Midwife | Admitting: Certified Nurse Midwife

## 2016-04-21 DIAGNOSIS — Z1231 Encounter for screening mammogram for malignant neoplasm of breast: Secondary | ICD-10-CM

## 2017-03-24 ENCOUNTER — Other Ambulatory Visit: Payer: Self-pay | Admitting: Certified Nurse Midwife

## 2017-03-24 DIAGNOSIS — Z1231 Encounter for screening mammogram for malignant neoplasm of breast: Secondary | ICD-10-CM

## 2017-04-25 ENCOUNTER — Other Ambulatory Visit: Payer: Self-pay | Admitting: Certified Nurse Midwife

## 2017-04-25 ENCOUNTER — Ambulatory Visit
Admission: RE | Admit: 2017-04-25 | Discharge: 2017-04-25 | Disposition: A | Discharge status: Home / Self Care | Payer: BC Managed Care – PPO | Source: Ambulatory Visit | Attending: Certified Nurse Midwife | Admitting: Certified Nurse Midwife

## 2017-04-25 DIAGNOSIS — R921 Mammographic calcification found on diagnostic imaging of breast: Secondary | ICD-10-CM

## 2017-04-25 DIAGNOSIS — R928 Other abnormal and inconclusive findings on diagnostic imaging of breast: Secondary | ICD-10-CM | POA: Insufficient documentation

## 2017-04-25 DIAGNOSIS — Z1231 Encounter for screening mammogram for malignant neoplasm of breast: Secondary | ICD-10-CM | POA: Insufficient documentation

## 2017-05-04 ENCOUNTER — Ambulatory Visit
Admission: RE | Admit: 2017-05-04 | Discharge: 2017-05-04 | Disposition: A | Discharge status: Home / Self Care | Payer: BC Managed Care – PPO | Source: Ambulatory Visit | Attending: Certified Nurse Midwife | Admitting: Certified Nurse Midwife

## 2017-05-04 DIAGNOSIS — R921 Mammographic calcification found on diagnostic imaging of breast: Secondary | ICD-10-CM

## 2017-05-04 DIAGNOSIS — R928 Other abnormal and inconclusive findings on diagnostic imaging of breast: Secondary | ICD-10-CM

## 2017-05-16 ENCOUNTER — Encounter: Payer: Self-pay | Admitting: Certified Nurse Midwife

## 2017-05-16 DIAGNOSIS — R921 Mammographic calcification found on diagnostic imaging of breast: Secondary | ICD-10-CM | POA: Insufficient documentation

## 2017-05-24 ENCOUNTER — Other Ambulatory Visit: Payer: Self-pay | Admitting: Certified Nurse Midwife

## 2017-05-24 DIAGNOSIS — R921 Mammographic calcification found on diagnostic imaging of breast: Secondary | ICD-10-CM

## 2017-06-08 ENCOUNTER — Telehealth: Payer: Self-pay | Admitting: Certified Nurse Midwife

## 2017-06-08 NOTE — Telephone Encounter (Signed)
Patient is scheduled for 6 mos f/u at [facility] on Monday, 11/06/17 @ 1:40pm.  No answer, v/m full.

## 2017-06-15 NOTE — Telephone Encounter (Signed)
No answer, v/m full on mobile#. Lmtrc on home#.

## 2017-06-22 NOTE — Telephone Encounter (Signed)
No answer, v/m full on mobile#. Lmtrc on home#.

## 2017-07-04 NOTE — Telephone Encounter (Signed)
Lmtrc on home#.

## 2017-07-12 NOTE — Telephone Encounter (Signed)
No answer, v/m full on mobile#. Lmtrc on home#.

## 2017-07-17 NOTE — Telephone Encounter (Signed)
Lmtrc on mobile#.

## 2017-07-17 NOTE — Telephone Encounter (Signed)
Patient is aware of appt.

## 2017-10-27 DIAGNOSIS — R928 Other abnormal and inconclusive findings on diagnostic imaging of breast: Secondary | ICD-10-CM

## 2017-10-27 HISTORY — DX: Other abnormal and inconclusive findings on diagnostic imaging of breast: R92.8

## 2017-11-06 ENCOUNTER — Ambulatory Visit
Admission: RE | Admit: 2017-11-06 | Discharge: 2017-11-06 | Disposition: A | Discharge status: Home / Self Care | Payer: BC Managed Care – PPO | Source: Ambulatory Visit | Attending: Certified Nurse Midwife | Admitting: Certified Nurse Midwife

## 2017-11-06 ENCOUNTER — Other Ambulatory Visit: Payer: Self-pay | Admitting: Certified Nurse Midwife

## 2017-11-06 DIAGNOSIS — R921 Mammographic calcification found on diagnostic imaging of breast: Secondary | ICD-10-CM | POA: Insufficient documentation

## 2017-11-06 DIAGNOSIS — R928 Other abnormal and inconclusive findings on diagnostic imaging of breast: Secondary | ICD-10-CM

## 2017-11-14 ENCOUNTER — Ambulatory Visit
Admission: RE | Admit: 2017-11-14 | Discharge: 2017-11-14 | Disposition: A | Discharge status: Home / Self Care | Payer: BC Managed Care – PPO | Source: Ambulatory Visit | Attending: Certified Nurse Midwife | Admitting: Certified Nurse Midwife

## 2017-11-14 DIAGNOSIS — R921 Mammographic calcification found on diagnostic imaging of breast: Secondary | ICD-10-CM | POA: Diagnosis present

## 2017-11-14 DIAGNOSIS — R928 Other abnormal and inconclusive findings on diagnostic imaging of breast: Secondary | ICD-10-CM

## 2017-11-14 HISTORY — PX: BREAST BIOPSY: SHX20

## 2017-11-15 LAB — SURGICAL PATHOLOGY

## 2017-11-17 ENCOUNTER — Other Ambulatory Visit: Payer: Self-pay | Admitting: Certified Nurse Midwife

## 2017-11-17 ENCOUNTER — Telehealth: Payer: Self-pay

## 2017-11-17 NOTE — Telephone Encounter (Signed)
Pt states [facility] has contacted [place] for a refill of her lexapro but hasn't heard back. She is down to 1 pill & will run out this weekend. Pt requesting refill be sent. [phone]

## 2017-11-18 ENCOUNTER — Other Ambulatory Visit: Payer: Self-pay | Admitting: Certified Nurse Midwife

## 2017-11-18 MED ORDER — ESCITALOPRAM OXALATE 20 MG PO TABS: 20.0000 mg | ORAL_TABLET | Freq: Every day | ORAL | 0 refills | Status: DC

## 2017-11-18 NOTE — Telephone Encounter (Signed)
I refilled this prescription. I think it is time for her annual. Please send her a reminder to schedule her annual.

## 2017-11-20 NOTE — Telephone Encounter (Signed)
Left msg with pt to make sure she received rx. Did not show confirmed received by pharmacy. Pt has scheduled annual for 12/05/17 with [facility].

## 2017-11-21 ENCOUNTER — Other Ambulatory Visit: Payer: Self-pay | Admitting: Certified Nurse Midwife

## 2017-12-05 ENCOUNTER — Encounter: Payer: Self-pay | Admitting: Certified Nurse Midwife

## 2017-12-05 ENCOUNTER — Ambulatory Visit (INDEPENDENT_AMBULATORY_CARE_PROVIDER_SITE_OTHER): Payer: BC Managed Care – PPO | Admitting: Certified Nurse Midwife

## 2017-12-05 VITALS — BP 104/62 | HR 65 | Ht 65.0 in | Wt 168.0 lb

## 2017-12-05 DIAGNOSIS — Z1211 Encounter for screening for malignant neoplasm of colon: Secondary | ICD-10-CM

## 2017-12-05 DIAGNOSIS — N393 Stress incontinence (female) (male): Secondary | ICD-10-CM

## 2017-12-05 DIAGNOSIS — Z01419 Encounter for gynecological examination (general) (routine) without abnormal findings: Secondary | ICD-10-CM | POA: Diagnosis not present

## 2017-12-05 DIAGNOSIS — Z13220 Encounter for screening for lipoid disorders: Secondary | ICD-10-CM

## 2017-12-05 DIAGNOSIS — L292 Pruritus vulvae: Secondary | ICD-10-CM

## 2017-12-05 DIAGNOSIS — Z124 Encounter for screening for malignant neoplasm of cervix: Secondary | ICD-10-CM

## 2017-12-05 NOTE — Telephone Encounter (Signed)
Pt scheduled to be seen today

## 2017-12-05 NOTE — Progress Notes (Signed)
Gynecology Annual Exam  PCP: Patient, No Pcp Per  Chief Complaint:  Chief Complaint  Patient presents with  . Gynecologic Exam    History of Present Illness:Claire Ramos is a 52 year old Caucasian/White female, G2 P2002, who presents for her annual exam. She is having problems with vulvar itching x 2 weeks and urinary incontinence with jumping or sneezing. She does not wear a pad. No dysuria. No unusual discharge. Her menses are absent due to IUD.  She has had no spotting. Denies hot flashes and night sweats.  The patient's past medical history is notable for a history of anxiety for which she takes Lexapro.  Since her last annual GYN exam dated 09/21/2016, she has had a left breast biopsy of calcifications which was benign. SHe has also lost 13# by decreasing calorie intake. She is sexually active. She is currently using an IUD for contraception. Mirena IUD was placed on 02/20/2013.  Her most recent pap smear was obtained 09/21/2016 and was negative.  Her most recent bilateral mammogram obtained 04/25/2017 and was Birads 3 due to several groups of calcifications in the left breast. A follow up left mammogram revealed changes in the 3-4 mm group of calcifications in the lower retroareolar left breast (Birads 4 ) and biopsy was done which was noted above. Diagnostic left breast mammogram is needed in 6 mos to check for stability of second group of calcifications. There is a positive history of breast cancer in her maternal great grandmother . Genetic testing is not indicated  There is no family history of ovarian cancer.  The patient does do monthly self breast exams.  The patient does not smoke.  The patient does drink occasionally.  The patient does not use illegal drugs.  The patient exercises occasionally by walking The patient does get adequate calcium in her diet.  She had a recent lipid panel in 2017: TC 180 tri:251, HDL 40, LDL 90 on a non fasting specimen.       Review of Systems: Review of Systems  Constitutional: Negative for chills, fever and weight loss.  HENT: Negative for congestion, sinus pain and sore throat.   Eyes: Positive for redness. Negative for blurred vision and pain.  Respiratory: Negative for hemoptysis, shortness of breath and wheezing.   Cardiovascular: Negative for chest pain, palpitations and leg swelling.  Gastrointestinal: Negative for abdominal pain, blood in stool, diarrhea, heartburn, nausea and vomiting.  Genitourinary: Negative for dysuria, frequency, hematuria and urgency.       Positive for stress urinary incontinence and vulvar itching.  Musculoskeletal: Negative for back pain, joint pain and myalgias.  Skin: Negative for itching and rash.  Neurological: Negative for dizziness, tingling and headaches.  Endo/Heme/Allergies: Negative for environmental allergies and polydipsia. Does not bruise/bleed easily.       Negative for hirsutism   Psychiatric/Behavioral: Negative for depression. The patient is not nervous/anxious and does not have insomnia.     Past Medical History:  Past Medical History:  Diagnosis Date  . Abnormal mammogram of left breast 10/2017   Birads 4 calcifications  . Anxiety   . Chronic kidney disease    KIDNEY STONE  . Palpitations     Past Surgical History:  Past Surgical History:  Procedure Laterality Date  . AUGMENTATION MAMMAPLASTY Bilateral 2003  . BREAST BIOPSY Left 11/14/2017   benign  . CYSTOSCOPY W/ RETROGRADES Bilateral 06/15/2015   Procedure: CYSTOSCOPY WITH RETROGRADE PYELOGRAM;  Surgeon: Festus Aloe, MD;  Location: [facility]  ORS;  Service: Urology;  Laterality: Bilateral;  . TONSILLECTOMY      Family History:  Family History  Problem Relation Age of Onset  . Asthma Mother   . Diabetes Mother   . Hypertension Maternal Grandmother   . Osteoarthritis Paternal Grandmother   . Breast cancer Other   . Kidney cancer Neg Hx   . Kidney disease Neg Hx   . Prostate cancer Neg  Hx     Social History:  Social History   Socioeconomic History  . Marital status: Married    Spouse name: Not on file  . Number of children: 2  . Years of education: Not on file  . Highest education level: Not on file  Social Needs  . Financial resource strain: Not on file  . Food insecurity - worry: Not on file  . Food insecurity - inability: Not on file  . Transportation needs - medical: Not on file  . Transportation needs - non-medical: Not on file  Occupational History  . Occupation: Estate manager/land agent  Tobacco Use  . Smoking status: Never Smoker  . Smokeless tobacco: Never Used  Substance and Sexual Activity  . Alcohol use: Yes    Alcohol/week: 0.0 oz    Comment: socially  . Drug use: No  . Sexual activity: Yes    Birth control/protection: IUD  Other Topics Concern  . Not on file  Social History Narrative  . Not on file    Allergies:  No Known Allergies  Medications:Physical Exam Vitals: BP 104/62   Pulse 65   Ht 5\' 5"  (1.651 m)   Wt 76.2 kg (168 lb)   BMI 27.96 kg/m   General: WF in NAD HEENT: normocephalic, anicteric Neck: no thyroid enlargement, no palpable nodules, no cervical lymphadenopathy  Pulmonary: No increased work of breathing, CTAB Cardiovascular: RRR, without murmur  Breast: Breast implants present, no tenderness, no palpable nodules or masses, no skin or nipple retraction present, no nipple discharge.  No axillary, infraclavicular or supraclavicular lymphadenopathy. Abdomen: Soft, non-tender, non-distended.  Umbilicus without lesions.  No hepatomegaly or masses palpable. No evidence of hernia. Genitourinary:  External: Normal external female genitalia.  Normal urethral meatus, normal Bartholin's and Skene's glands.    Vagina: Normal vaginal mucosa, no evidence of prolapse.    Cervix: Grossly normal in appearance, no bleeding, non-tender, IUD strings present  Uterus: Anteflexed, normal size, shape, and consistency, mobile, and  non-tender  Adnexa: No adnexal masses, non-tender  Rectal: deferred  Lymphatic: no evidence of inguinal lymphadenopathy Extremities: no edema, erythema, or tenderness Neurologic: Grossly intact Psychiatric: mood appropriate, affect full  Wet prep: Results for orders placed or performed in visit on 12/05/17 (from the past 48 hour(s))  POCT Wet Prep [facility] [place])     Status: Abnormal   Collection Time: 12/07/17  8:00 PM  Result Value Ref Range   Source Wet Prep POC vagina    WBC, Wet Prep HPF POC     Bacteria Wet Prep HPF POC  Few   BACTERIA WET PREP MORPHOLOGY POC     Clue Cells Wet Prep HPF POC None None   Clue Cells Wet Prep Whiff POC     Yeast Wet Prep HPF POC None    KOH Wet Prep POC     Trichomonas Wet Prep HPF POC Absent Absent     Assessment: 52 y.o. annual gyn exam Stress urinary incontinence Anxiety-doing well on Lexapro  Plan:   1) Breast cancer screening - recommend monthly self breast exam.  Needs annual mammogram and diagnostic left mammogram in August 2019   2) Colon cancer screening: Discussed options: desires referral for colonoscopy  3) Cervical cancer screening - Pap was done. ASCCP guidelines and rational discussed.  Patient opts for yearly screening interval  4) Contraception - Approaching 5 year mark for Mirena. Since so close to menopause, will leave current IUD in place. Check FSH and LH next year. FSH and LH last year was normal.  5) Repeat fasting lipid panel  ordered today. Patient to return when fasting.  6) RTO 1 year and prn  7) Refill of Lexapro to pharmacy.  Dalia Heading, CNM

## 2017-12-07 ENCOUNTER — Encounter: Payer: Self-pay | Admitting: Certified Nurse Midwife

## 2017-12-07 LAB — POCT WET PREP (WET MOUNT): Trichomonas Wet Prep HPF POC: ABSENT

## 2017-12-07 MED ORDER — ESCITALOPRAM OXALATE 20 MG PO TABS: 20.0000 mg | ORAL_TABLET | Freq: Every day | ORAL | 3 refills | Status: DC

## 2017-12-08 LAB — IGP, APTIMA HPV
HPV APTIMA: NEGATIVE
PAP Smear Comment: 0

## 2017-12-28 ENCOUNTER — Encounter: Payer: Self-pay | Admitting: *Deleted

## 2018-01-10 DIAGNOSIS — D239 Other benign neoplasm of skin, unspecified: Secondary | ICD-10-CM

## 2018-01-10 HISTORY — DX: Other benign neoplasm of skin, unspecified: D23.9

## 2018-04-16 ENCOUNTER — Telehealth: Payer: Self-pay

## 2018-04-16 ENCOUNTER — Other Ambulatory Visit: Payer: Self-pay

## 2018-04-16 ENCOUNTER — Encounter: Payer: Self-pay | Admitting: *Deleted

## 2018-04-16 DIAGNOSIS — Z1211 Encounter for screening for malignant neoplasm of colon: Secondary | ICD-10-CM

## 2018-04-16 MED ORDER — NA SULFATE-K SULFATE-MG SULF 17.5-3.13-1.6 GM/177ML PO SOLN: 1.0000 | Freq: Once | ORAL | 0 refills | Status: AC

## 2018-04-16 NOTE — Telephone Encounter (Signed)
Pt just spoke to [place]. She was seen 11/14/17 & had a biopsy done. She is now due for f/u. They need order from [facility] for f/u mammo. [phone]

## 2018-04-17 ENCOUNTER — Other Ambulatory Visit: Payer: Self-pay | Admitting: Certified Nurse Midwife

## 2018-04-17 DIAGNOSIS — Z9889 Other specified postprocedural states: Secondary | ICD-10-CM

## 2018-04-17 DIAGNOSIS — R921 Mammographic calcification found on diagnostic imaging of breast: Secondary | ICD-10-CM

## 2018-04-17 DIAGNOSIS — Z1239 Encounter for other screening for malignant neoplasm of breast: Secondary | ICD-10-CM

## 2018-04-20 NOTE — Discharge Instructions (Signed)
General Anesthesia, Adult, Care After °These instructions provide you with information about caring for yourself after your procedure. Your health care provider may also give you more specific instructions. Your treatment has been planned according to current medical practices, but problems sometimes occur. Call your health care provider if you have any problems or questions after your procedure. °What can I expect after the procedure? °After the procedure, it is common to have: °· Vomiting. °· A sore throat. °· Mental slowness. ° °It is common to feel: °· Nauseous. °· Cold or shivery. °· Sleepy. °· Tired. °· Sore or achy, even in parts of your body where you did not have surgery. ° °Follow these instructions at home: °For at least 24 hours after the procedure: °· Do not: °? Participate in activities where you could fall or become injured. °? Drive. °? Use heavy machinery. °? Drink alcohol. °? Take sleeping pills or medicines that cause drowsiness. °? Make important decisions or sign legal documents. °? Take care of children on your own. °· Rest. °Eating and drinking °· If you vomit, drink water, juice, or soup when you can drink without vomiting. °· Drink enough fluid to keep your urine clear or pale yellow. °· Make sure you have little or no nausea before eating solid foods. °· Follow the diet recommended by your health care provider. °General instructions °· Have a responsible adult stay with you until you are awake and alert. °· Return to your normal activities as told by your health care provider. Ask your health care provider what activities are safe for you. °· Take over-the-counter and prescription medicines only as told by your health care provider. °· If you smoke, do not smoke without supervision. °· Keep all follow-up visits as told by your health care provider. This is important. °Contact a health care provider if: °· You continue to have nausea or vomiting at home, and medicines are not helpful. °· You  cannot drink fluids or start eating again. °· You cannot urinate after 8-12 hours. °· You develop a skin rash. °· You have fever. °· You have increasing redness at the site of your procedure. °Get help right away if: °· You have difficulty breathing. °· You have chest pain. °· You have unexpected bleeding. °· You feel that you are having a life-threatening or urgent problem. °This information is not intended to replace advice given to you by your health care provider. Make sure you discuss any questions you have with your health care provider. °Document Released: 12/19/2000 Document Revised: 02/15/2016 Document Reviewed: 08/27/2015 °Elsevier Interactive Patient Education © 2018 Elsevier Inc. ° °

## 2018-04-23 ENCOUNTER — Ambulatory Visit
Admission: RE | Admit: 2018-04-23 | Discharge: 2018-04-23 | Disposition: A | Discharge status: Home / Self Care | Payer: BC Managed Care – PPO | Source: Ambulatory Visit | Attending: Gastroenterology | Admitting: Gastroenterology

## 2018-04-23 ENCOUNTER — Encounter
Admission: RE | Disposition: A | Discharge status: Home / Self Care | Payer: Self-pay | Source: Ambulatory Visit | Attending: Gastroenterology

## 2018-04-23 ENCOUNTER — Ambulatory Visit: Payer: BC Managed Care – PPO | Admitting: Anesthesiology

## 2018-04-23 DIAGNOSIS — Z1211 Encounter for screening for malignant neoplasm of colon: Secondary | ICD-10-CM

## 2018-04-23 DIAGNOSIS — N189 Chronic kidney disease, unspecified: Secondary | ICD-10-CM | POA: Diagnosis not present

## 2018-04-23 DIAGNOSIS — F419 Anxiety disorder, unspecified: Secondary | ICD-10-CM | POA: Insufficient documentation

## 2018-04-23 DIAGNOSIS — Z79899 Other long term (current) drug therapy: Secondary | ICD-10-CM | POA: Insufficient documentation

## 2018-04-23 DIAGNOSIS — Z87442 Personal history of urinary calculi: Secondary | ICD-10-CM | POA: Diagnosis not present

## 2018-04-23 HISTORY — PX: COLONOSCOPY WITH PROPOFOL: SHX5780

## 2018-04-23 SURGERY — COLONOSCOPY WITH PROPOFOL
Anesthesia: General | Site: Rectum | Wound class: Contaminated

## 2018-04-23 MED ORDER — LIDOCAINE HCL (CARDIAC) PF 100 MG/5ML IV SOSY
PREFILLED_SYRINGE | INTRAVENOUS | Status: DC | PRN
  Administered 2018-04-23: 40 mg via INTRAVENOUS

## 2018-04-23 MED ORDER — STERILE WATER FOR IRRIGATION IR SOLN
Status: DC | PRN
  Administered 2018-04-23: .5 mL

## 2018-04-23 MED ORDER — OXYCODONE HCL 5 MG PO TABS: 5.0000 mg | ORAL_TABLET | Freq: Once | ORAL | Status: DC | PRN

## 2018-04-23 MED ORDER — LACTATED RINGERS IV SOLN
INTRAVENOUS | Status: DC
  Administered 2018-04-23: 09:00:00 via INTRAVENOUS

## 2018-04-23 MED ORDER — PROPOFOL 10 MG/ML IV BOLUS
INTRAVENOUS | Status: DC | PRN
Start: 2018-04-23 — End: 2018-04-23
  Administered 2018-04-23 (×2): 20 mg via INTRAVENOUS
  Administered 2018-04-23: 30 mg via INTRAVENOUS
  Administered 2018-04-23: 20 mg via INTRAVENOUS
  Administered 2018-04-23 (×2): 30 mg via INTRAVENOUS
  Administered 2018-04-23: 100 mg via INTRAVENOUS

## 2018-04-23 MED ORDER — SODIUM CHLORIDE 0.9 % IV SOLN: INTRAVENOUS | Status: DC

## 2018-04-23 MED ORDER — OXYCODONE HCL 5 MG/5ML PO SOLN: 5.0000 mg | Freq: Once | ORAL | Status: DC | PRN

## 2018-04-23 SURGICAL SUPPLY — 5 items
CANISTER SUCT 1200ML W/VALVE (MISCELLANEOUS) ×3 IMPLANT
GOWN CVR UNV OPN BCK APRN NK (MISCELLANEOUS) ×2 IMPLANT
GOWN ISOL THUMB LOOP REG UNIV (MISCELLANEOUS) ×4
KIT ENDO PROCEDURE OLY (KITS) ×3 IMPLANT
WATER STERILE IRR 250ML POUR (IV SOLUTION) ×3 IMPLANT

## 2018-04-23 NOTE — Anesthesia Postprocedure Evaluation (Signed)
Anesthesia Post Note  Patient: Claire Ramos  Procedure(s) Performed: COLONOSCOPY WITH PROPOFOL (N/A Rectum)  Patient location during evaluation: PACU Anesthesia Type: General Level of consciousness: awake and alert Pain management: pain level controlled Vital Signs Assessment: post-procedure vital signs reviewed and stable Respiratory status: spontaneous breathing, nonlabored ventilation, respiratory function stable and patient connected to nasal cannula oxygen Cardiovascular status: blood pressure returned to baseline and stable Postop Assessment: no apparent nausea or vomiting Anesthetic complications: no    Charissa Knowles

## 2018-04-23 NOTE — Anesthesia Procedure Notes (Signed)
Procedure Name: MAC Date/Time: 04/23/2018 8:55 AM Performed by: Janna Arch, CRNA Pre-anesthesia Checklist: Patient identified, Emergency Drugs available, Suction available and Patient being monitored Patient Re-evaluated:Patient Re-evaluated prior to induction Oxygen Delivery Method: Nasal cannula

## 2018-04-23 NOTE — Anesthesia Preprocedure Evaluation (Signed)
Anesthesia Evaluation  Patient identified by MRN, date of birth, ID band  Reviewed: NPO status   History of Anesthesia Complications Negative for: history of anesthetic complications  Airway Mallampati: II  TM Distance: >3 FB Neck ROM: full    Dental no notable dental hx.    Pulmonary neg pulmonary ROS,    Pulmonary exam normal        Cardiovascular Exercise Tolerance: Good negative cardio ROS Normal cardiovascular exam(-) dysrhythmias      Neuro/Psych Anxiety negative neurological ROS     GI/Hepatic negative GI ROS, Neg liver ROS,   Endo/Other  negative endocrine ROS  Renal/GU Kidney stones 2016   negative genitourinary   Musculoskeletal   Abdominal   Peds  Hematology negative hematology ROS (+)   Anesthesia Other Findings tiva  Reproductive/Obstetrics                             Anesthesia Physical Anesthesia Plan  ASA: II  Anesthesia Plan: General   Post-op Pain Management:    Induction:   PONV Risk Score and Plan:   Airway Management Planned:   Additional Equipment:   Intra-op Plan:   Post-operative Plan:   Informed Consent: I have reviewed the patients History and Physical, chart, labs and discussed the procedure including the risks, benefits and alternatives for the proposed anesthesia with the patient or authorized representative who has indicated his/her understanding and acceptance.     Plan Discussed with: CRNA  Anesthesia Plan Comments:         Anesthesia Quick Evaluation

## 2018-04-23 NOTE — Op Note (Signed)
[facility] Patient Name: Claire Ramos Procedure Date: 04/23/2018 8:50 AM MRN: [id number] Account #: [id number] Date of Birth: 1965-11-11 Admit Type: Inpatient Age: 52 Room: [facility] OR ROOM 01 Gender: Female Note Status: Finalized Procedure:            Colonoscopy Indications:          Screening for colorectal malignant neoplasm Providers:            Lucilla Lame MD, MD Medicines:            Propofol per Anesthesia Complications:        No immediate complications. Procedure:            Pre-Anesthesia Assessment:                       - Prior to the procedure, a History and Physical was                        performed, and patient medications and allergies were                        reviewed. The patient's tolerance of previous                        anesthesia was also reviewed. The risks and benefits of                        the procedure and the sedation options and risks were                        discussed with the patient. All questions were                        answered, and informed consent was obtained. Prior                        Anticoagulants: The patient has taken no previous                        anticoagulant or antiplatelet agents. ASA Grade                        Assessment: II - A patient with mild systemic disease.                        After reviewing the risks and benefits, the patient was                        deemed in satisfactory condition to undergo the                        procedure.                       After obtaining informed consent, the colonoscope was                        passed under direct vision. Throughout the procedure,                        the patient's blood pressure,  pulse, and oxygen                        saturations were monitored continuously. The was                        introduced through the anus and advanced to the the                        cecum, identified by appendiceal orifice  and ileocecal                        valve. The colonoscopy was performed without                        difficulty. The patient tolerated the procedure well.                        The quality of the bowel preparation was excellent. Findings:      The perianal and digital rectal examinations were normal.      The entire examined colon appeared normal. Impression:           - The entire examined colon is normal.                       - No specimens collected. Recommendation:       - Discharge patient to home.                       - Resume previous diet.                       - Continue present medications. Procedure Code(s):    --- Professional ---                       [phone], Colonoscopy, flexible; diagnostic, including                        collection of specimen(s) by brushing or washing, when                        performed (separate procedure) Diagnosis Code(s):    --- Professional ---                       Z12.11, Encounter for screening for malignant neoplasm                        of colon CPT copyright 2017 American Medical Association. All rights reserved. The codes documented in this report are preliminary and upon coder review may  be revised to meet current compliance requirements. Lucilla Lame MD, MD 04/23/2018 9:11:32 AM This report has been signed electronically. Number of Addenda: 0 Note Initiated On: 04/23/2018 8:50 AM Scope Withdrawal Time: 0 hours 7 minutes 20 seconds  Total Procedure Duration: 0 hours 10 minutes 2 seconds       [facility]

## 2018-04-23 NOTE — H&P (Signed)
Lucilla Lame, MD [address] [place], [place] [address] Phone: [phone] Fax : [phone]  Primary Care Physician:  Patient, No Pcp Per Primary Gastroenterologist:  Dr. Allen Norris  Pre-Procedure History & Physical: HPI:  Claire Ramos is a 52 y.o. female is here for a screening colonoscopy.   Past Medical History:  Diagnosis Date  . Abnormal mammogram of left breast 10/2017   Birads 4 calcifications  . Anxiety   . Chronic kidney disease    KIDNEY STONE  . Palpitations     Past Surgical History:  Procedure Laterality Date  . AUGMENTATION MAMMAPLASTY Bilateral 2003  . BREAST BIOPSY Left 11/14/2017   benign  . CYSTOSCOPY W/ RETROGRADES Bilateral 06/15/2015   Procedure: CYSTOSCOPY WITH RETROGRADE PYELOGRAM;  Surgeon: Festus Aloe, MD;  Location: [facility];  Service: Urology;  Laterality: Bilateral;  . TONSILLECTOMY      Prior to Admission medications   Medication Sig Start Date End Date Taking? Authorizing Provider  escitalopram (LEXAPRO) 20 MG tablet Take 1 tablet (20 mg total) by mouth daily. 12/07/17  Yes Dalia Heading, CNM  Levonorgestrel (MIRENA, 52 MG, IU) 52 mg by Intrauterine route.     [provider]    Allergies as of 04/16/2018  . (No Known Allergies)    Family History  Problem Relation Age of Onset  . Asthma Mother   . Diabetes Mother   . Hypertension Maternal Grandmother   . Osteoarthritis Paternal Grandmother   . Breast cancer Other   . Kidney cancer Neg Hx   . Kidney disease Neg Hx   . Prostate cancer Neg Hx     Social History   Socioeconomic History  . Marital status: Married    Spouse name: Not on file  . Number of children: 2  . Years of education: Not on file  . Highest education level: Not on file  Occupational History  . Occupation: Estate manager/land agent  Social Needs  . Financial resource strain: Not on file  . Food insecurity:    Worry: Not on file    Inability: Not on file  .  Transportation needs:    Medical: Not on file    Non-medical: Not on file  Tobacco Use  . Smoking status: Never Smoker  . Smokeless tobacco: Never Used  Substance and Sexual Activity  . Alcohol use: Yes    Alcohol/week: 0.0 oz    Comment: socially - may have 1 drink/wk  . Drug use: No  . Sexual activity: Yes    Birth control/protection: IUD  Lifestyle  . Physical activity:    Days per week: 3 days    Minutes per session: 20 min  . Stress: Only a little  Relationships  . Social connections:    Talks on phone: More than three times a week    Gets together: Once a week    Attends religious service: More than 4 times per year    Active member of club or organization: No    Attends meetings of clubs or organizations: Never    Relationship status: Married  . Intimate partner violence:    Fear of current or ex partner: No    Emotionally abused: No    Physically abused: No    Forced sexual activity: No  Other Topics Concern  . Not on file  Social History Narrative  . Not on file    Review of Systems: See HPI, otherwise negative ROS  Physical Exam: BP 113/77   Pulse  84   Temp 97.9 F (36.6 C) (Temporal)   Ht 5\' 5"  (1.651 m)   Wt 164 lb (74.4 kg)   SpO2 98%   BMI 27.29 kg/m  General:   Alert,  pleasant and cooperative in NAD Head:  Normocephalic and atraumatic. Neck:  Supple; no masses or thyromegaly. Lungs:  Clear throughout to auscultation.    Heart:  Regular rate and rhythm. Abdomen:  Soft, nontender and nondistended. Normal bowel sounds, without guarding, and without rebound.   Neurologic:  Alert and  oriented x4;  grossly normal neurologically.  Impression/Plan: Claire Ramos is now here to undergo a screening colonoscopy.  Risks, benefits, and alternatives regarding colonoscopy have been reviewed with the patient.  Questions have been answered.  All parties agreeable.

## 2018-04-23 NOTE — Transfer of Care (Signed)
Immediate Anesthesia Transfer of Care Note  Patient: Claire Ramos  Procedure(s) Performed: COLONOSCOPY WITH PROPOFOL (N/A Rectum)  Patient Location: PACU  Anesthesia Type: General  Level of Consciousness: awake, alert  and patient cooperative  Airway and Oxygen Therapy: Patient Spontanous Breathing and Patient connected to supplemental oxygen  Post-op Assessment: Post-op Vital signs reviewed, Patient's Cardiovascular Status Stable, Respiratory Function Stable, Patent Airway and No signs of Nausea or vomiting  Post-op Vital Signs: Reviewed and stable  Complications: No apparent anesthesia complications

## 2018-04-24 ENCOUNTER — Encounter: Payer: Self-pay | Admitting: Gastroenterology

## 2018-04-30 ENCOUNTER — Ambulatory Visit
Admission: RE | Admit: 2018-04-30 | Discharge: 2018-04-30 | Disposition: A | Discharge status: Home / Self Care | Payer: BC Managed Care – PPO | Source: Ambulatory Visit | Attending: Certified Nurse Midwife | Admitting: Certified Nurse Midwife

## 2018-04-30 DIAGNOSIS — Z1231 Encounter for screening mammogram for malignant neoplasm of breast: Secondary | ICD-10-CM | POA: Diagnosis present

## 2018-04-30 DIAGNOSIS — Z9889 Other specified postprocedural states: Secondary | ICD-10-CM | POA: Diagnosis present

## 2018-04-30 DIAGNOSIS — R921 Mammographic calcification found on diagnostic imaging of breast: Secondary | ICD-10-CM | POA: Insufficient documentation

## 2018-04-30 DIAGNOSIS — Z1239 Encounter for other screening for malignant neoplasm of breast: Secondary | ICD-10-CM

## 2018-05-04 ENCOUNTER — Other Ambulatory Visit: Payer: BC Managed Care – PPO

## 2019-03-02 ENCOUNTER — Other Ambulatory Visit: Payer: Self-pay | Admitting: Certified Nurse Midwife

## 2019-04-03 ENCOUNTER — Other Ambulatory Visit: Payer: Self-pay | Admitting: Certified Nurse Midwife

## 2019-04-03 DIAGNOSIS — R921 Mammographic calcification found on diagnostic imaging of breast: Secondary | ICD-10-CM

## 2019-04-08 ENCOUNTER — Telehealth: Payer: Self-pay

## 2019-04-08 NOTE — Telephone Encounter (Signed)
Pt calling for order for mammogram.  [phone]  Adv pt per Gwenyth Bouillon wants pts to have breast exam before they have their mammogram.  Pt already scheduled for AEX 05/15/19.

## 2019-05-15 ENCOUNTER — Encounter: Payer: Self-pay | Admitting: Certified Nurse Midwife

## 2019-05-15 ENCOUNTER — Other Ambulatory Visit: Payer: Self-pay

## 2019-05-15 ENCOUNTER — Ambulatory Visit (INDEPENDENT_AMBULATORY_CARE_PROVIDER_SITE_OTHER): Payer: BC Managed Care – PPO | Admitting: Certified Nurse Midwife

## 2019-05-15 VITALS — BP 126/70 | HR 63 | Ht 67.0 in | Wt 158.8 lb

## 2019-05-15 DIAGNOSIS — R920 Mammographic microcalcification found on diagnostic imaging of breast: Secondary | ICD-10-CM

## 2019-05-15 DIAGNOSIS — Z124 Encounter for screening for malignant neoplasm of cervix: Secondary | ICD-10-CM

## 2019-05-15 DIAGNOSIS — L292 Pruritus vulvae: Secondary | ICD-10-CM | POA: Diagnosis not present

## 2019-05-15 DIAGNOSIS — Z1239 Encounter for other screening for malignant neoplasm of breast: Secondary | ICD-10-CM

## 2019-05-15 DIAGNOSIS — N912 Amenorrhea, unspecified: Secondary | ICD-10-CM

## 2019-05-15 DIAGNOSIS — Z01419 Encounter for gynecological examination (general) (routine) without abnormal findings: Secondary | ICD-10-CM | POA: Diagnosis not present

## 2019-05-15 DIAGNOSIS — E781 Pure hyperglyceridemia: Secondary | ICD-10-CM

## 2019-05-15 LAB — POCT WET PREP (WET MOUNT): Trichomonas Wet Prep HPF POC: ABSENT

## 2019-05-15 MED ORDER — ZOLPIDEM TARTRATE ER 6.25 MG PO TBCR: 6.2500 mg | EXTENDED_RELEASE_TABLET | Freq: Every evening | ORAL | 1 refills | Status: DC | PRN

## 2019-05-15 MED ORDER — ESCITALOPRAM OXALATE 20 MG PO TABS: 20.0000 mg | ORAL_TABLET | Freq: Every day | ORAL | 3 refills | Status: DC

## 2019-05-15 MED ORDER — CLOTRIMAZOLE-BETAMETHASONE 1-0.05 % EX CREA: 1.0000 "application " | TOPICAL_CREAM | Freq: Two times a day (BID) | CUTANEOUS | 0 refills | Status: DC | PRN

## 2019-05-15 MED ORDER — FLUCONAZOLE 150 MG PO TABS: 150.0000 mg | ORAL_TABLET | Freq: Once | ORAL | 0 refills | Status: AC

## 2019-05-15 NOTE — Progress Notes (Signed)
Gynecology Annual Exam  PCP: Patient, No Pcp Per  Chief Complaint:  Chief Complaint  Patient presents with  . Gynecologic Exam    poss yeast inf; not sleeping well; still on lexapro generic; when will mirena will expire    History of Present Illness:Claire Ramos is a 53 year old Caucasian/White female, G2 P2002, who presents for her annual exam. She is having problems with vulvovaginal itching x 4 days.  No unusual discharge. Used Monistat 1 vaginally. Still having some itching. No change in soaps, detergents, hygiene products. More recently she has been having trouble sleeping. 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 She has much anxiety about the school year and helping her students. Has a hard time " turning her mind off" at night.  Her menses are absent due to IUD.  She has had no spotting.  Started having hot flashes this summer.  The patient's past medical history is notable for a history of anxiety for which she takes Lexapro.  Since her last annual GYN exam dated 12/05/2017, she has had no significant changes in her health.  She has lost 10# since her last annual by diet and exercise. She lost 13# the year before. She is sexually active. She is currently using an IUD for contraception. Mirena IUD was placed on 02/20/2013.  Her most recent pap smear was obtained 12/05/2017 and was NIL/negative HRHPV.  Her most recent bilateral mammogram obtained 04/30/2018 and was Birads 3. She had a breast biopsy on some calcifications in the left breast 11/15/2017 which was benign. . Diagnostic left breast mammogram is needed this year to check for stability There is a positive history of breast cancer in her maternal great grandmother . Genetic testing is not indicated  She had a colonoscopy 04/23/2018 which was normal. (Dr Allen Norris) There is no family history of ovarian cancer.  The patient does do monthly self breast exams.  The patient does not smoke.  The patient does  drink occasionally.  The patient does not use illegal drugs.  The patient exercises 3-4x/weeks by running/walking. The patient does get adequate calcium in her diet.  She had a recent lipid panel in 2017: TC 180 tri:251, HDL 40, LDL 90 on a non fasting specimen.     Review of Systems: Review of Systems  Constitutional: Positive for weight loss (intentional). Negative for chills and fever.  HENT: Negative for congestion, sinus pain and sore throat.   Eyes: Negative for blurred vision, pain and redness.  Respiratory: Negative for hemoptysis, shortness of breath and wheezing.   Cardiovascular: Negative for chest pain, palpitations and leg swelling.  Gastrointestinal: Negative for abdominal pain, blood in stool, diarrhea, heartburn, nausea and vomiting.  Genitourinary: Negative for dysuria, frequency, hematuria and urgency.       Positive for vulvovaginal  Itching and amenorrhea  Musculoskeletal: Negative for back pain, joint pain and myalgias.  Skin: Negative for itching and rash.  Neurological: Negative for dizziness, tingling and headaches.  Endo/Heme/Allergies: Negative for environmental allergies and polydipsia. Does not bruise/bleed easily.       Positive for hot flashes   Psychiatric/Behavioral: Negative for depression. The patient has insomnia. The patient is not nervous/anxious.     Past Medical History:  Past Medical History:  Diagnosis Date  . Abnormal mammogram of left breast 10/2017   Birads 4 calcifications  . Anxiety   . Chronic kidney disease    KIDNEY STONE  . Palpitations  Past Surgical History:  Past Surgical History:  Procedure Laterality Date  . AUGMENTATION MAMMAPLASTY Bilateral 2003  . BREAST BIOPSY Left 11/14/2017   benign  . COLONOSCOPY WITH PROPOFOL N/A 04/23/2018   Procedure: COLONOSCOPY WITH PROPOFOL;  Surgeon: Lucilla Lame, MD;  Location: [place];  Service: Endoscopy;  Laterality: N/A;  . CYSTOSCOPY W/ RETROGRADES Bilateral 06/15/2015    Procedure: CYSTOSCOPY WITH RETROGRADE PYELOGRAM;  Surgeon: Festus Aloe, MD;  Location: [facility];  Service: Urology;  Laterality: Bilateral;  . TONSILLECTOMY      Family History:  Family History  Problem Relation Age of Onset  . Asthma Mother   . Diabetes Mother   . Hypertension Maternal Grandmother   . Osteoarthritis Paternal Grandmother   . Breast cancer Other   . Kidney cancer Neg Hx   . Kidney disease Neg Hx   . Prostate cancer Neg Hx     Social History:  Social History   Socioeconomic History  . Marital status: Married    Spouse name: Not on file  . Number of children: 2  . Years of education: Not on file  . Highest education level: Not on file  Occupational History  . Occupation: Estate manager/land agent  Social Needs  . Financial resource strain: Not on file  . Food insecurity    Worry: Not on file    Inability: Not on file  . Transportation needs    Medical: Not on file    Non-medical: Not on file  Tobacco Use  . Smoking status: Never Smoker  . Smokeless tobacco: Never Used  Substance and Sexual Activity  . Alcohol use: Yes    Alcohol/week: 0.0 standard drinks    Comment: socially - may have 1 drink/wk  . Drug use: No  . Sexual activity: Yes    Birth control/protection: I.U.D.  Lifestyle  . Physical activity    Days per week: 3 days    Minutes per session: 20 min  . Stress: Only a little  Relationships  . Social connections    Talks on phone: More than three times a week    Gets together: Once a week    Attends religious service: More than 4 times per year    Active member of club or organization: No    Attends meetings of clubs or organizations: Never    Relationship status: Married  . Intimate partner violence    Fear of current or ex partner: No    Emotionally abused: No    Physically abused: No    Forced sexual activity: No  Other Topics Concern  . Not on file  Social History Narrative  . Not on file    Allergies:  No Known  Allergies  Medications:Physical Exam Vitals: BP 126/70   Pulse 63   Ht 5\' 7"  (1.702 m)   Wt 158 lb 12.8 oz (72 kg)   LMP  (LMP Unknown)   BMI 24.87 kg/m   General: WF in NAD HEENT: normocephalic, anicteric Neck: no thyroid enlargement, no palpable nodules, no cervical lymphadenopathy  Pulmonary: No increased work of breathing, CTAB Cardiovascular: RRR, without murmur  Breast: Breast implants present, no tenderness, no palpable nodules or masses, no skin or nipple retraction present, no nipple discharge.  No axillary, infraclavicular or supraclavicular lymphadenopathy. Abdomen: Soft, non-tender, non-distended.  Umbilicus without lesions.  No hepatomegaly or masses palpable. No evidence of hernia. Genitourinary:  External: Normal external female genitalia.  Normal urethral meatus, normal Bartholin's and Skene's glands.    Vagina:  Normal vaginal mucosa, no evidence of prolapse, scant white discharge    Cervix: Grossly normal in appearance, no bleeding, non-tender, IUD strings present  Uterus: Anteflexed, normal size, shape, and consistency, mobile, and non-tender  Adnexa: No adnexal masses, non-tender  Rectal: deferred  Lymphatic: no evidence of inguinal lymphadenopathy Extremities: no edema, erythema, or tenderness Neurologic: Grossly intact Psychiatric: mood appropriate, affect full  Wet prep: Results for orders placed or performed in visit on 05/15/19 (from the past 24 hour(s))  POCT Wet Prep [facility] [place])     Status: Normal   Collection Time: 05/15/19  8:59 PM  Result Value Ref Range   Source Wet Prep POC vagina    WBC, Wet Prep HPF POC     Bacteria Wet Prep HPF POC Few Few   BACTERIA WET PREP MORPHOLOGY POC     Clue Cells Wet Prep HPF POC None None   Clue Cells Wet Prep Whiff POC     Yeast Wet Prep HPF POC None None   KOH Wet Prep POC     Trichomonas Wet Prep HPF POC Absent Absent    Assessment: 53 y.o. annual gyn exam Anxiety-continue Lexapro Insomnia Vulvovaginal  itching-s/p treatment for monilia with Monistat 1  Plan:   1) Breast cancer screening - recommend monthly self breast exam. Diagnostic/ annual mammogram ordered  2) Colon cancer screening: Colonoscopy negative in 2019. Next due in 2029  3) Cervical cancer screening - Pap was done. ASCCP guidelines and rational discussed.  Patient opts for yearly screening interval  4) Contraception - Mirena now in place 6 years. . Since so close to menopause and she is now symptomatic with hot flashes, will leave current IUD in place if [facility] and LH are elevated. FSH and LH drawn today  5) Repeat fasting lipid panel  ordered today. Patient to return when fasting.  6) Will treat empirically for monilia with Diflucan 150 mgm x1 and Lotrisone BID prn itching externally  7) Refill of Lexapro to pharmacy. Discussed avoiding any screens 1 hour before bedtime, time a warm bath prior to bedtime to help relax. Trial of Ambien for sleep.          Dalia Heading, CNM

## 2019-05-16 LAB — FSH/LH
FSH: 105 m[IU]/mL
LH: 60.5 m[IU]/mL

## 2019-05-17 ENCOUNTER — Other Ambulatory Visit: Payer: BC Managed Care – PPO

## 2019-05-17 ENCOUNTER — Other Ambulatory Visit: Payer: Self-pay

## 2019-05-17 ENCOUNTER — Telehealth: Payer: Self-pay | Admitting: Certified Nurse Midwife

## 2019-05-17 DIAGNOSIS — E781 Pure hyperglyceridemia: Secondary | ICD-10-CM

## 2019-05-17 NOTE — Telephone Encounter (Signed)
Left message letting patient know her mammogram is scheduled for 06/06/2019 @ 2:40pm.

## 2019-05-17 NOTE — Telephone Encounter (Signed)
-----   Message from [place] Lodge sent at 05/16/2019  8:23 AM EDT ----- Regarding: FW: schedule diagnostic mammogram Please schedule the patient's appointment. Thanks. ----- Message ----- From: Dalia Heading, CNM Sent: 05/15/2019   9:20 PM EDT To: Alexandria Lodge Subject: schedule diagnostic mammogram                  Please schedule diagnostic mammogram. She needs a diagnostic left mammogram to follow up on calcifications but it is also time for her annual mammogram. Thanks, colleen

## 2019-05-18 LAB — IGP,RFX APTIMA HPV ALL PTH

## 2019-05-18 LAB — LIPID PANEL
Chol/HDL Ratio: 3.6 ratio (ref 0.0–4.4)
Cholesterol, Total: 161 mg/dL (ref 100–199)
HDL: 45 mg/dL (ref >39)
LDL Calculated: 91 mg/dL (ref 0–99)
Triglycerides: 126 mg/dL (ref 0–149)
VLDL Cholesterol Cal: 25 mg/dL (ref 5–40)

## 2019-05-20 ENCOUNTER — Encounter: Payer: Self-pay | Admitting: Certified Nurse Midwife

## 2019-06-06 ENCOUNTER — Other Ambulatory Visit: Payer: BC Managed Care – PPO

## 2019-06-21 ENCOUNTER — Other Ambulatory Visit: Payer: BC Managed Care – PPO

## 2019-07-03 ENCOUNTER — Ambulatory Visit
Admission: RE | Admit: 2019-07-03 | Discharge: 2019-07-03 | Disposition: A | Discharge status: Home / Self Care | Payer: BC Managed Care – PPO | Source: Ambulatory Visit | Attending: Certified Nurse Midwife | Admitting: Certified Nurse Midwife

## 2019-07-03 DIAGNOSIS — R921 Mammographic calcification found on diagnostic imaging of breast: Secondary | ICD-10-CM

## 2019-08-19 IMAGING — MG MM DIGITAL DIAGNOSTIC UNILAT*L* IMPLANT W/ TOMO W/ CAD
8 of 14 series · 8 of 22 positions shown · non-contrast
Comparison: 05/04/2017 (left), 04/25/2017 (bilateral), 04/21/2016
(bilateral) and earlier.

CLINICAL DATA: Six-month interval follow-up of 2 groups of left
breast calcifications. Patient has indwelling bilateral
retropectoral saline implants.

EXAM:
2D DIGITAL DIAGNOSTIC LEFT MAMMOGRAM WITH IMPLANTS, CAD AND ADJUNCT
TOMO

[L ML (1 of 4)]
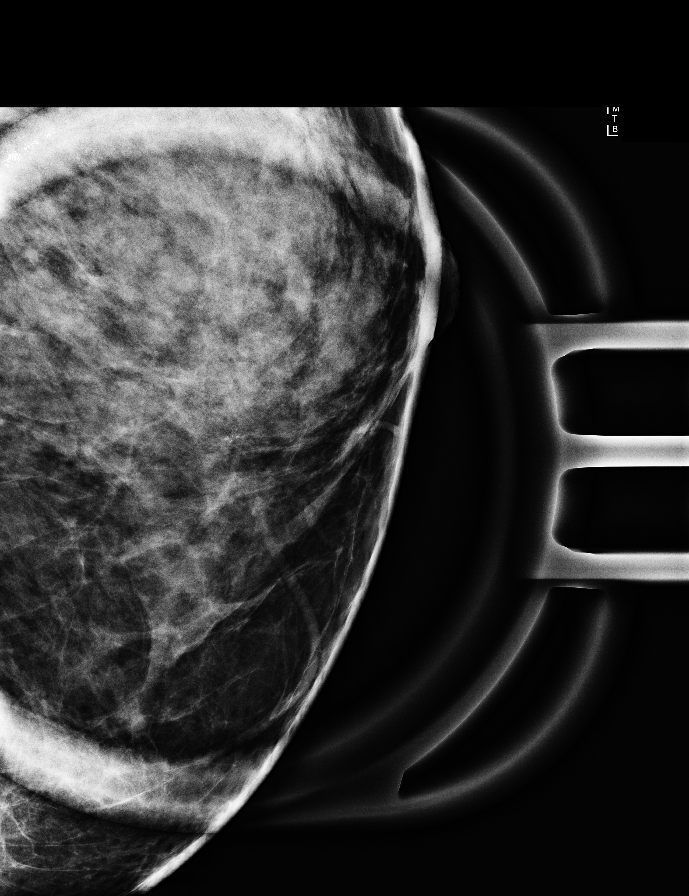

[L CC (1 of 3)]
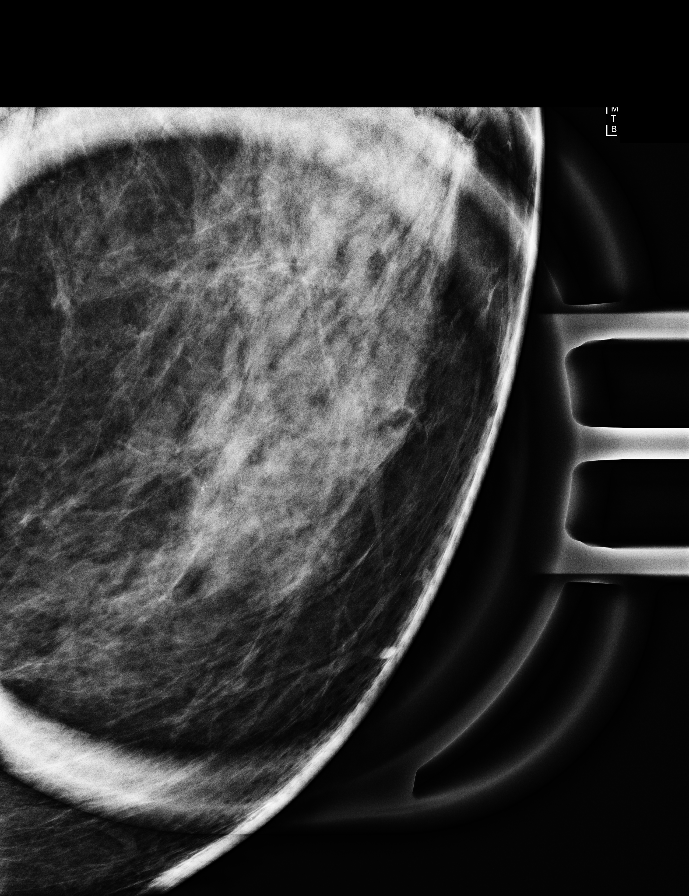

[L ML (2 of 4)]
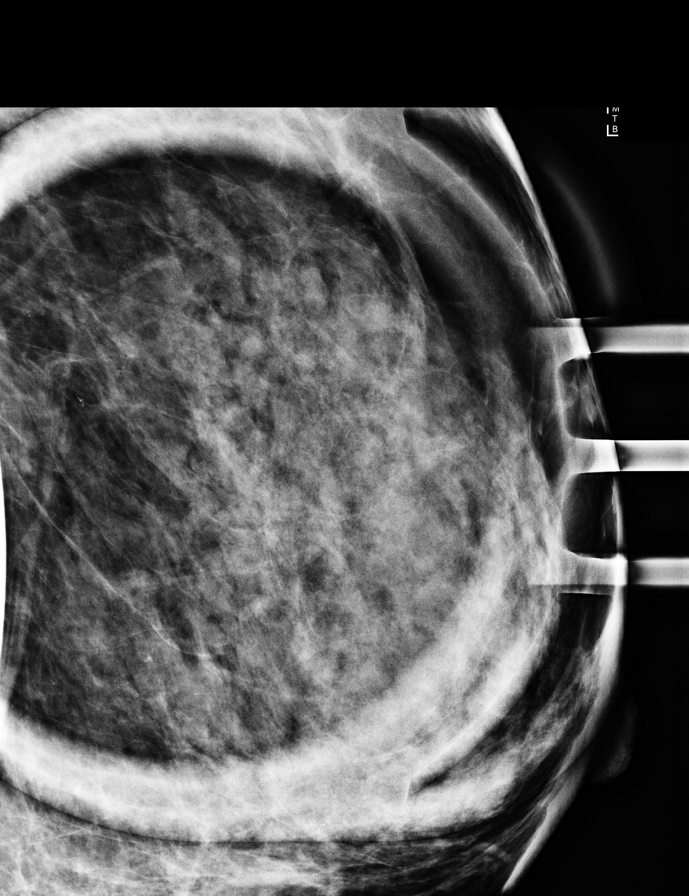

[L ML (3 of 4)]
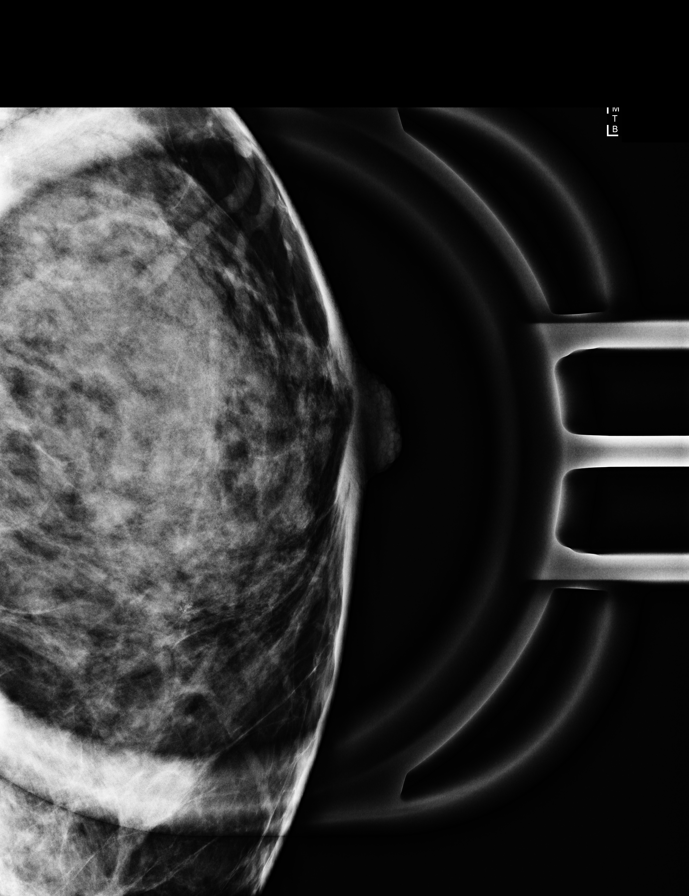

[L CC (2 of 3)]
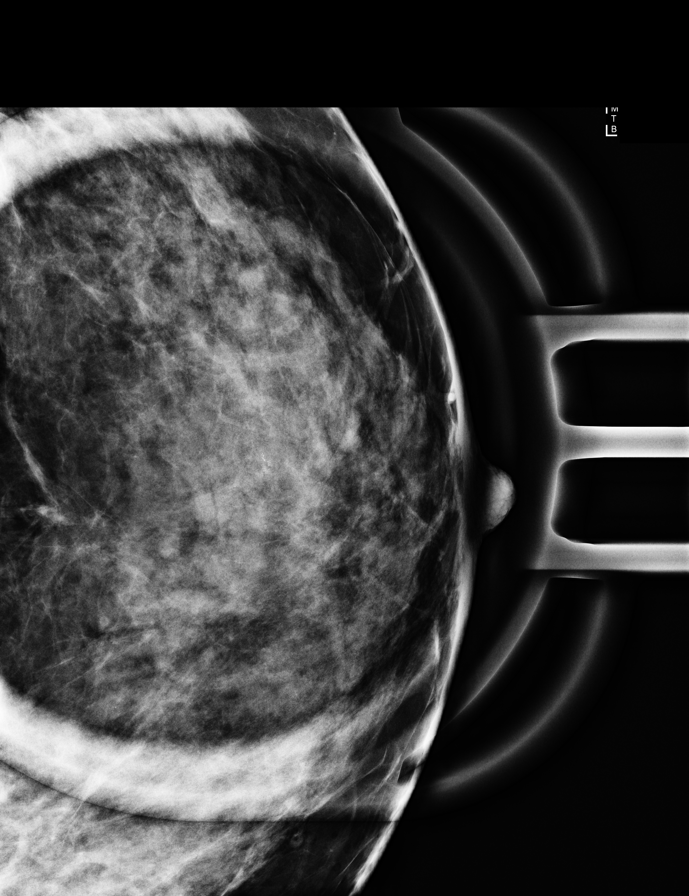

[L MLO]
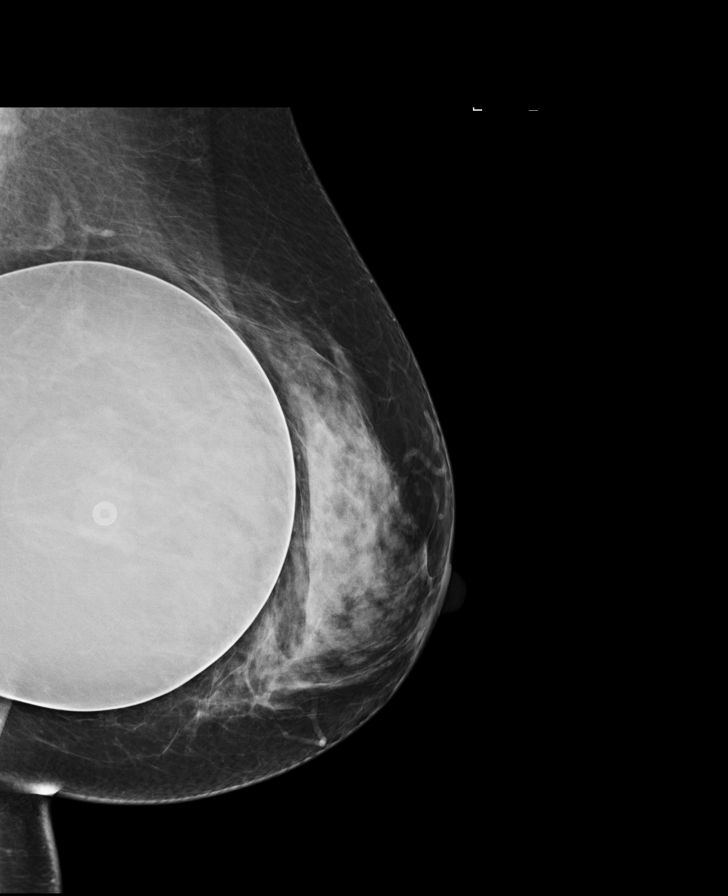

[L CC (3 of 3)]
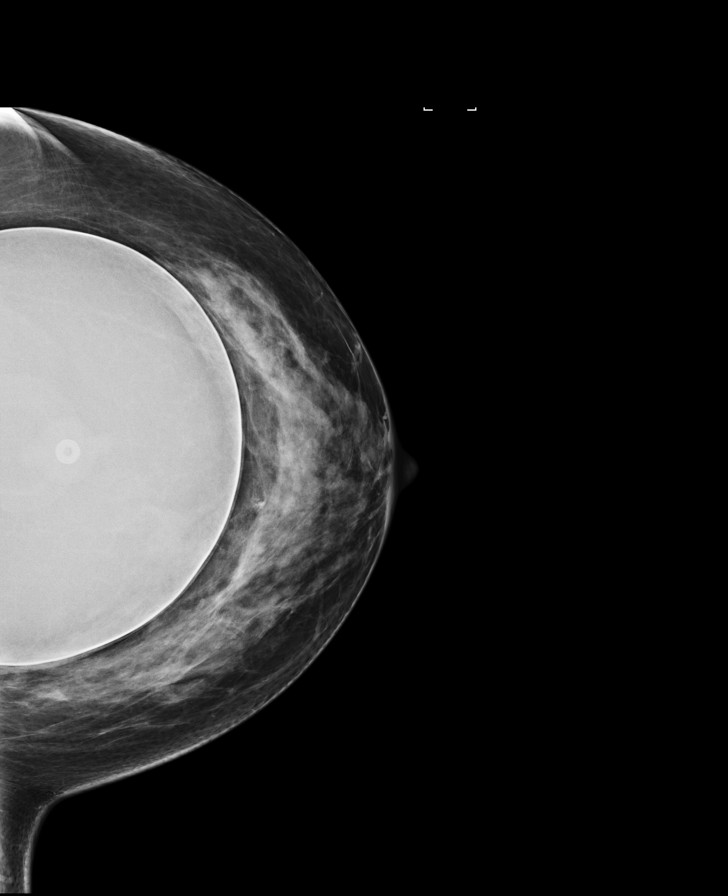

[L ML (4 of 4)]
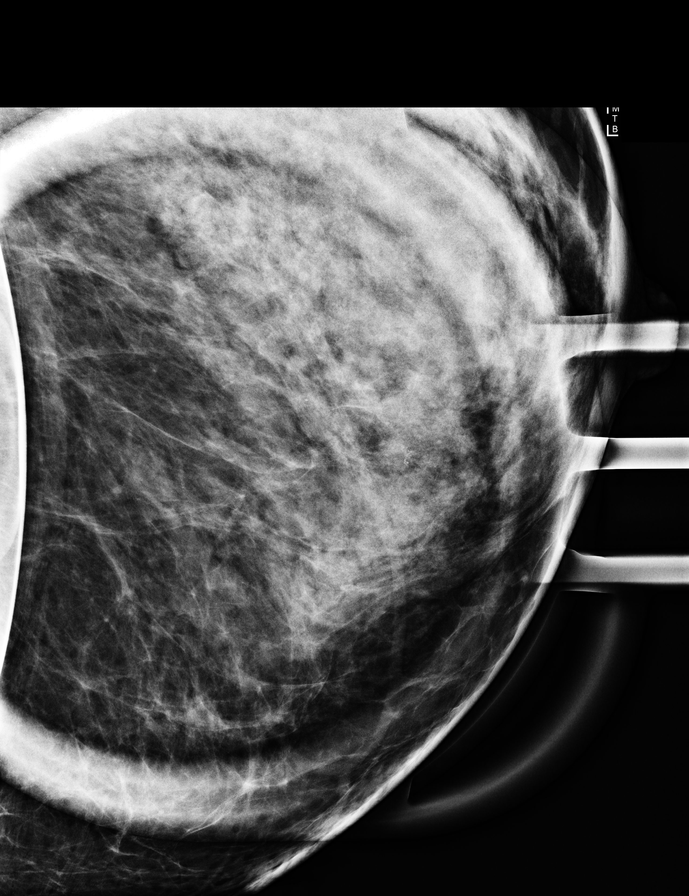

[8 of 22 positions shown; findings below may reference images not displayed]

ACR Breast Density Category d: The breast tissue is extremely dense,
which lowers the sensitivity of mammography.
FINDINGS: Standard 2D full field CC and MLO views of the left breast and
standard and tomosynthesis implant displaced CC and mediolateral
views of the left breast were obtained. Additionally, standard spot
magnification views of the 2 groups of calcifications in the left
breast were also obtained.

The 2 mm group of punctate calcifications in the upper inner
quadrant at posterior depth are unchanged in appearance since the
spot magnification views 6 months ago.

The 3-4 mm group of calcifications in the lower retroareolar left
breast at anterior depth have changed somewhat in their morphology
since the examination 6 months ago, now with a fine heterogeneous
appearance including possible faint linear and branching forms.
There is no associated mass or architectural distortion.

Mammographic images were processed with CAD.
IMPRESSION: 1. Indeterminate 3-4 mm group of fine heterogeneous calcifications
in the lower retroareolar left breast at anterior depth.
2. Stable likely benign 2 mm group of punctate calcifications in the
upper inner quadrant of the left breast at posterior depth.

RECOMMENDATION:
1. Stereotactic tomosynthesis biopsy of the indeterminate
calcifications in the lower retroareolar left breast at anterior
depth.
2. If these calcifications are benign, then spot magnification views
of the calcifications in the upper inner quadrant at posterior depth
are recommended at the time of annual bilateral diagnostic
mammography in 6 months.
3. If these calcifications demonstrate atypia, then biopsy of the
calcifications in the upper inner quadrant at posterior depth is
recommended.

The stereotactic tomosynthesis biopsy procedure was discussed with
the patient and her questions were answered. The patient will be
contacted with an appointment for the biopsy.

I have discussed the findings and recommendations with the patient.
Results were also provided in writing at the conclusion of the
visit.

BI-RADS CATEGORY  4: Suspicious.

## 2019-08-27 IMAGING — MG MM BREAST LOCALIZATION CLIP
3 series · 3 of 3 positions shown · non-contrast
Comparison: Previous exam(s).

CLINICAL DATA: Post stereotactic guided biopsy of calcifications in
the lower retroareolar/slightly lower outer left breast.

EXAM:
DIAGNOSTIC LEFT MAMMOGRAM POST STEREOTACTIC BIOPSY

[L CC (1 of 2)]
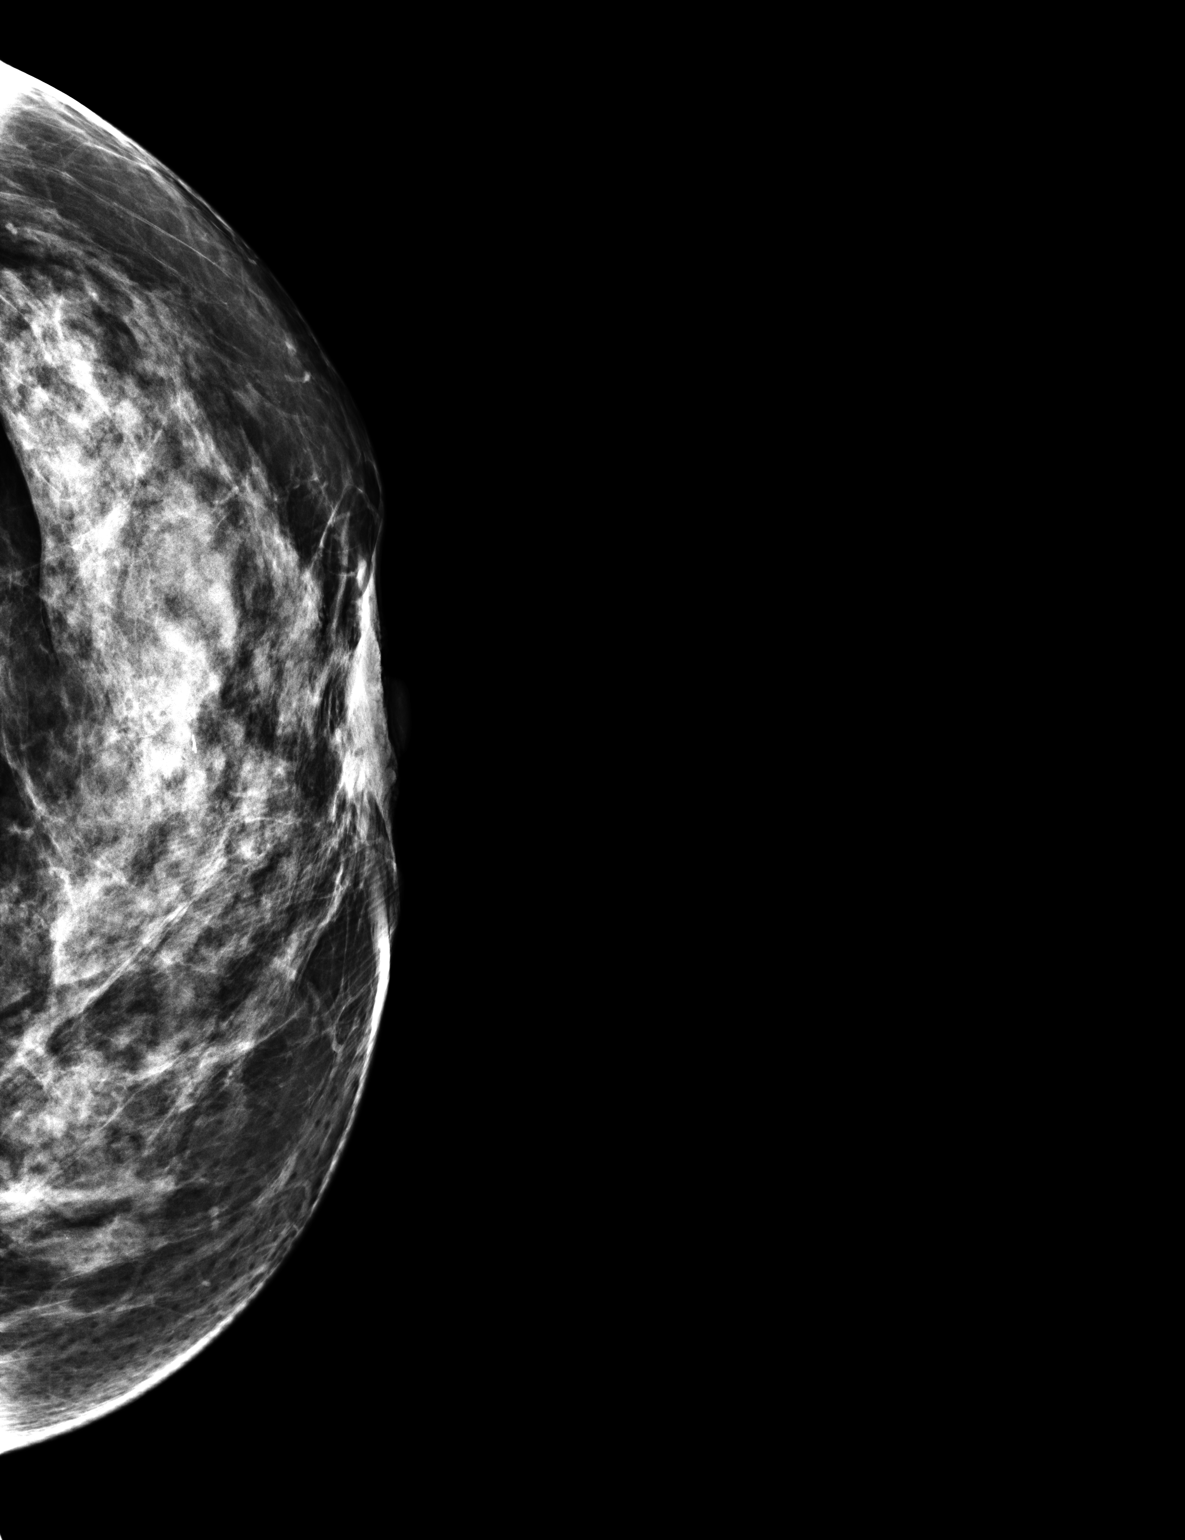

[L ML]
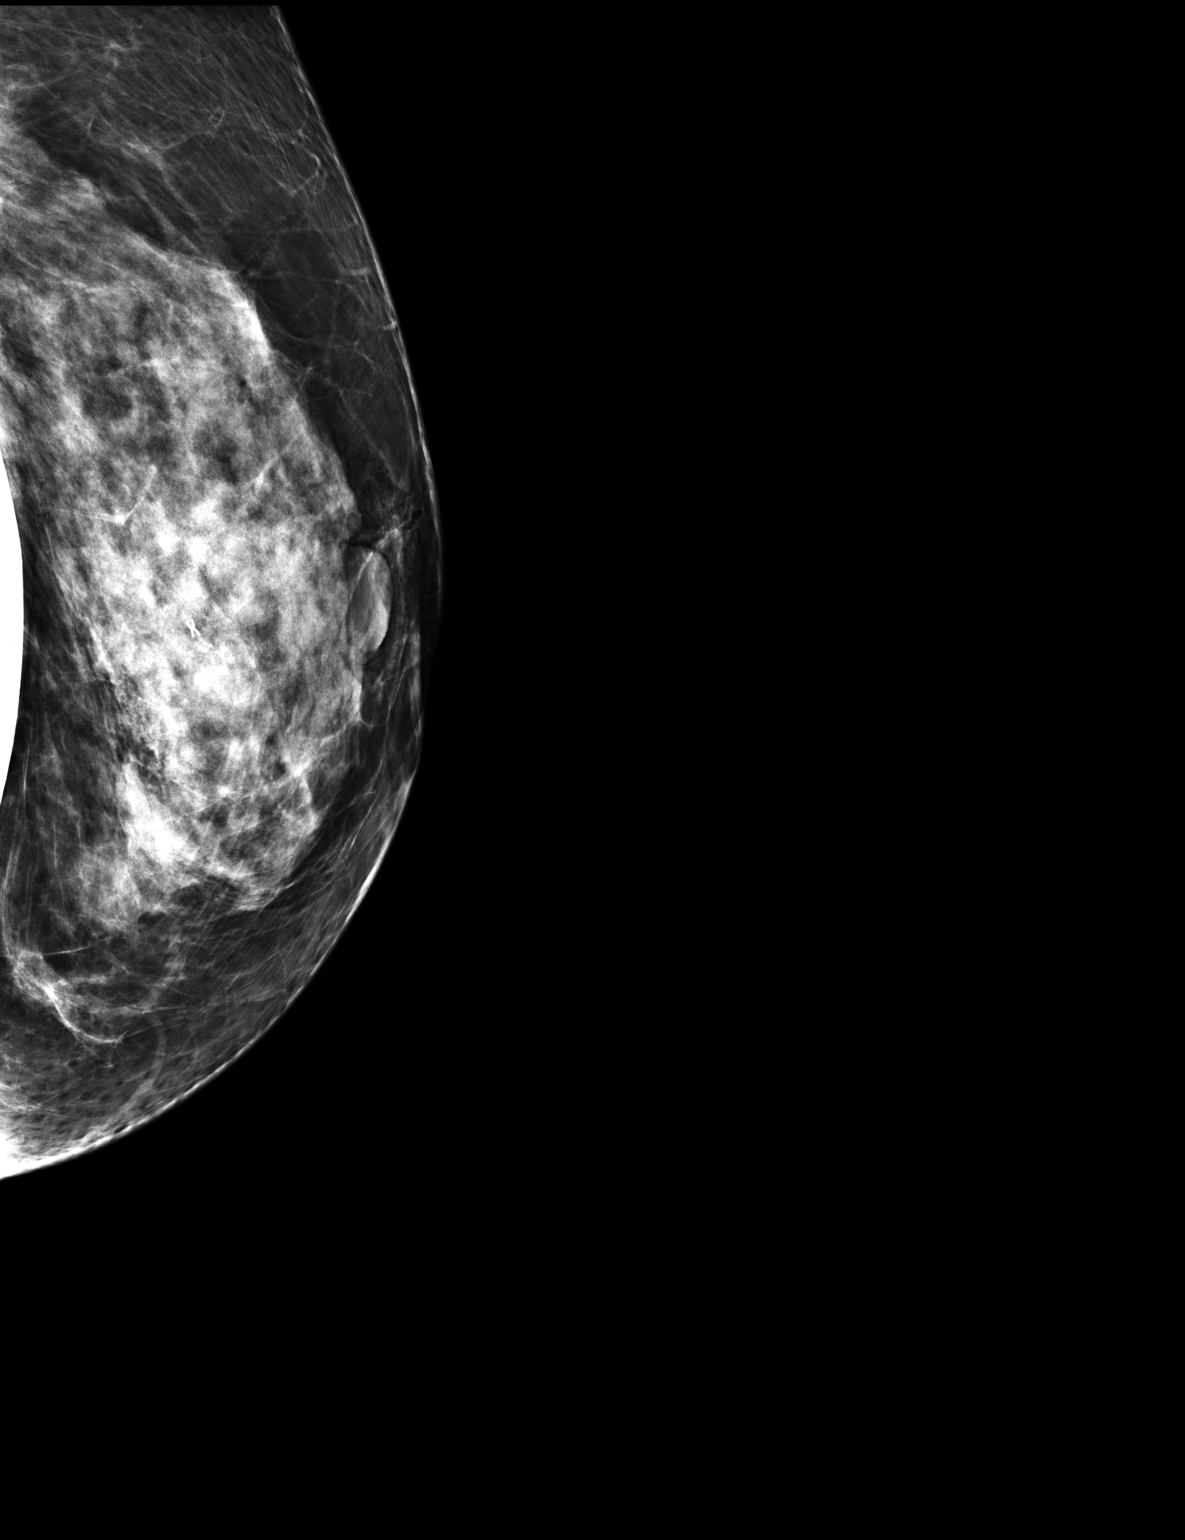

[L CC (2 of 2)]
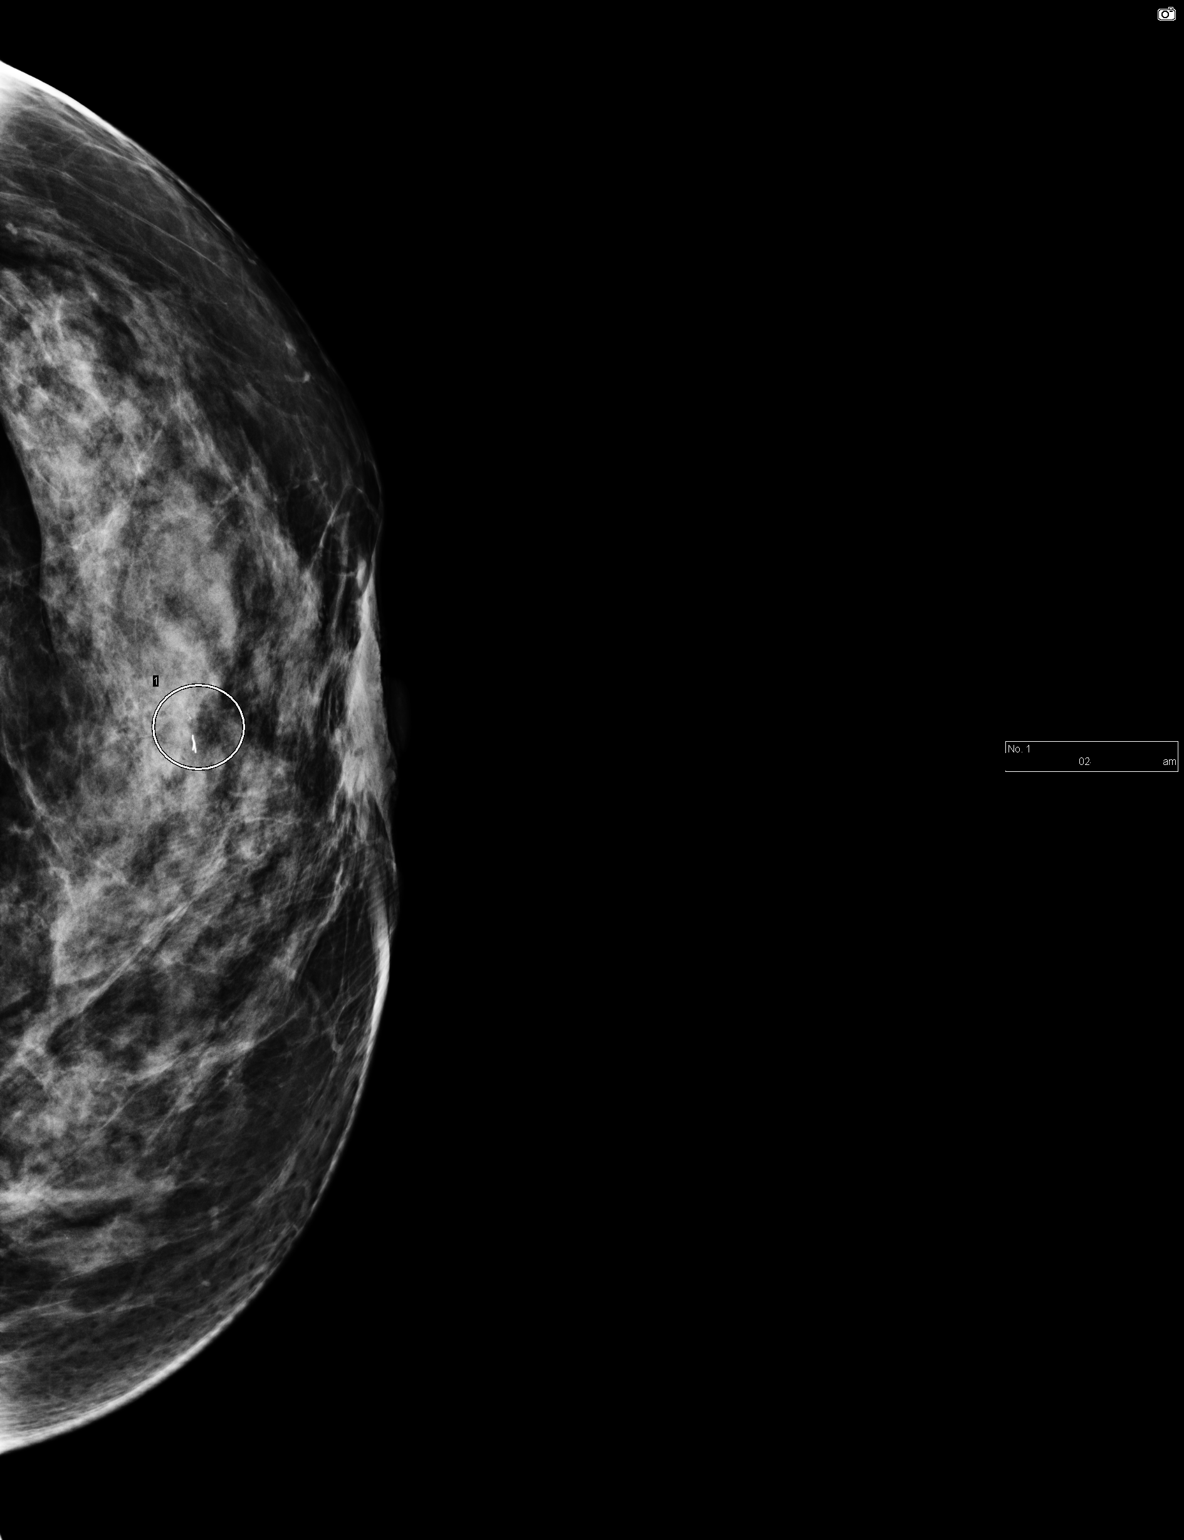

[3 of 3 positions shown; findings below may reference images not displayed]

FINDINGS: Mammographic images were obtained following stereotactic guided
biopsy of calcifications in the lower retroareolar/slightly lower
outer left breast. An X shaped biopsy marking clip is present at the
site of the biopsied calcifications in the left breast.
IMPRESSION: X shaped biopsy marking clip at site of biopsied calcifications in
the lower retroareolar/slightly lower outer left breast.

Final Assessment: Post Procedure Mammograms for Marker Placement

## 2019-09-05 ENCOUNTER — Other Ambulatory Visit: Payer: Self-pay

## 2019-09-05 ENCOUNTER — Telehealth: Payer: Self-pay | Admitting: Certified Nurse Midwife

## 2019-09-05 ENCOUNTER — Telehealth: Payer: Self-pay

## 2019-09-05 ENCOUNTER — Ambulatory Visit (INDEPENDENT_AMBULATORY_CARE_PROVIDER_SITE_OTHER): Payer: BC Managed Care – PPO

## 2019-09-05 DIAGNOSIS — R300 Dysuria: Secondary | ICD-10-CM | POA: Diagnosis not present

## 2019-09-05 LAB — POCT URINALYSIS DIPSTICK
Bilirubin, UA: NEGATIVE
Blood, UA: POSITIVE
Glucose, UA: NEGATIVE
Ketones, UA: NEGATIVE
Nitrite, UA: NEGATIVE
Protein, UA: NEGATIVE
Spec Grav, UA: 1.01 (ref 1.010–1.025)
Urobilinogen, UA: 0.2 E.U./dL
pH, UA: 5 (ref 5.0–8.0)

## 2019-09-05 MED ORDER — PHENAZOPYRIDINE HCL 200 MG PO TABS: 200.0000 mg | ORAL_TABLET | Freq: Three times a day (TID) | ORAL | 0 refills | Status: DC | PRN

## 2019-09-05 MED ORDER — NITROFURANTOIN MONOHYD MACRO 100 MG PO CAPS: 100.0000 mg | ORAL_CAPSULE | Freq: Two times a day (BID) | ORAL | 0 refills | Status: DC

## 2019-09-05 NOTE — Telephone Encounter (Signed)
Pt calling triage, reporting symptoms of a UTI. Left message to advise pt to drop off urine sample to be checked for uti

## 2019-09-05 NOTE — Telephone Encounter (Signed)
Patient called with results of her urine dipstick today when she presented with a pulling sensation when she urinates and urinary frequency x 2 days. Urine dipstick revealed +blood and +leukocytes. A Urine culture was sent. Will start on Macrobid bid and Pyridium tid prn while awaiting urine culture results. Dalia Heading, CNM

## 2019-09-05 NOTE — Telephone Encounter (Signed)
Pleft voice message CLG will send in rx to pharmacy

## 2019-09-05 NOTE — Telephone Encounter (Signed)
Pt left urine sample, has blood and leukocytes her pharmacy is [facility]

## 2019-09-07 LAB — URINE CULTURE

## 2019-10-05 ENCOUNTER — Other Ambulatory Visit: Payer: Self-pay | Admitting: Certified Nurse Midwife

## 2020-05-21 ENCOUNTER — Encounter: Payer: Self-pay | Admitting: Certified Nurse Midwife

## 2020-05-21 ENCOUNTER — Other Ambulatory Visit: Payer: Self-pay

## 2020-05-21 ENCOUNTER — Ambulatory Visit (INDEPENDENT_AMBULATORY_CARE_PROVIDER_SITE_OTHER): Payer: BC Managed Care – PPO | Admitting: Certified Nurse Midwife

## 2020-05-21 VITALS — BP 122/76 | HR 71 | Ht 67.0 in | Wt 172.0 lb

## 2020-05-21 DIAGNOSIS — F419 Anxiety disorder, unspecified: Secondary | ICD-10-CM

## 2020-05-21 DIAGNOSIS — G4700 Insomnia, unspecified: Secondary | ICD-10-CM

## 2020-05-21 DIAGNOSIS — Z01419 Encounter for gynecological examination (general) (routine) without abnormal findings: Secondary | ICD-10-CM

## 2020-05-21 DIAGNOSIS — Z30432 Encounter for removal of intrauterine contraceptive device: Secondary | ICD-10-CM

## 2020-05-21 DIAGNOSIS — Z1231 Encounter for screening mammogram for malignant neoplasm of breast: Secondary | ICD-10-CM | POA: Diagnosis not present

## 2020-05-21 MED ORDER — HYDROXYZINE HCL 25 MG PO TABS: 25.0000 mg | ORAL_TABLET | Freq: Four times a day (QID) | ORAL | 2 refills | Status: DC | PRN

## 2020-05-21 MED ORDER — ESCITALOPRAM OXALATE 20 MG PO TABS: 20.0000 mg | ORAL_TABLET | Freq: Every day | ORAL | 3 refills | Status: DC

## 2020-05-21 NOTE — Progress Notes (Signed)
Gynecology Annual Exam  PCP: Dalia Heading, CNM  Chief Complaint:  Chief Complaint  Patient presents with  . Gynecologic Exam    not sleeping good - alot going on in family; ? arthritis in hips; what to do about mirena    History of Present Illness:Claire Ramos is a 54 year old Caucasian/White female, [place], who presents for her annual exam. 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 She has much anxiety about the school year and helping her students. Her M-I-L is 63 has been having health problems including needing a valve replacement, having a embolism following her surgery and now cancer involving her colon and possibly her Fallopian tube. Has a hard time " turning her mind off" at night.  Her menses are absent due to IUD but had menopause level FSH and LH last year. She has had no spotting. Having some hot flashes.   The patient's past medical history is notable for a history of anxiety for which she takes Lexapro.  Since her last annual GYN exam dated 05/15/2019, she has had no significant changes in her health.  She has gained back some of the weight that she lost previously. She reports that her daughter just started nursing school and [employer]. She is sexually active. She is currently using an IUD for contraception. Mirena IUD was placed on 02/20/2013.  Her most recent pap smear was obtained 05/15/2019 and was NIL Her most recent bilateral mammogram obtained 07/03/2019 and was Birads 2 for stable calcifications in her left breast. She had a breast biopsy on some calcifications in the left breast 11/15/2017 which was benign. There is a positive history of breast cancer in her maternal great grandmother . Genetic testing is not indicated  She had a colonoscopy 04/23/2018 which was normal. (Dr Allen Norris). Next one is due? In 10 years There is no family history of ovarian cancer.  The patient does do monthly self breast exams.  The patient does not smoke.  The  patient does drink occasionally.  The patient does not use illegal drugs.  The patient exercises 3-4x/weeks by running/walking. The patient does get adequate calcium in her diet.  She had a recent lipid panel 05/17/2019 which was normal.   Review of Systems: Review of Systems  Constitutional: Negative for chills, fever and weight loss.       Positive for 14# weight gain  HENT: Negative for congestion, sinus pain and sore throat.   Eyes: Negative for blurred vision, pain and redness.  Respiratory: Negative for hemoptysis, shortness of breath and wheezing.   Cardiovascular: Negative for chest pain, palpitations and leg swelling.  Gastrointestinal: Negative for abdominal pain, blood in stool, diarrhea, heartburn, nausea and vomiting.  Genitourinary: Negative for dysuria, frequency, hematuria and urgency.       Positive for amenorrhea and difficulty holding urine  Musculoskeletal: Positive for joint pain (hip pain RT>LT). Negative for back pain and myalgias.  Skin: Negative for itching and rash.  Neurological: Negative for dizziness, tingling and headaches.  Endo/Heme/Allergies: Negative for environmental allergies and polydipsia. Does not bruise/bleed easily.       Positive for hot flashes   Psychiatric/Behavioral: Negative for depression. The patient is nervous/anxious and has insomnia.     Past Medical History:  Past Medical History:  Diagnosis Date  . Abnormal mammogram of left breast 10/2017   Birads 4 calcifications  . Anxiety   . Chronic kidney disease    KIDNEY  STONE  . Palpitations     Past Surgical History:  Past Surgical History:  Procedure Laterality Date  . AUGMENTATION MAMMAPLASTY Bilateral 2003  . BREAST BIOPSY Left 11/14/2017   benign  . COLONOSCOPY WITH PROPOFOL N/A 04/23/2018   Procedure: COLONOSCOPY WITH PROPOFOL;  Surgeon: Lucilla Lame, MD;  Location: [facility];  Service: Endoscopy;  Laterality: N/A;  . CYSTOSCOPY W/ RETROGRADES Bilateral 06/15/2015     Procedure: CYSTOSCOPY WITH RETROGRADE PYELOGRAM;  Surgeon: Festus Aloe, MD;  Location: [facility];  Service: Urology;  Laterality: Bilateral;  . TONSILLECTOMY      Family History:  Family History  Problem Relation Age of Onset  . Asthma Mother   . Diabetes Mother   . Hypertension Maternal Grandmother   . Osteoarthritis Paternal Grandmother   . Breast cancer Other   . Kidney cancer Neg Hx   . Kidney disease Neg Hx   . Prostate cancer Neg Hx     Social History:  Social History   Socioeconomic History  . Marital status: Married    Spouse name: Not on file  . Number of children: 2  . Years of education: Not on file  . Highest education level: Not on file  Occupational History  . Occupation: Estate manager/land agent  Tobacco Use  . Smoking status: Never Smoker  . Smokeless tobacco: Never Used  Vaping Use  . Vaping Use: Never used  Substance and Sexual Activity  . Alcohol use: Yes    Alcohol/week: 0.0 standard drinks    Comment: socially - may have 1 drink/wk  . Drug use: No  . Sexual activity: Yes    Birth control/protection: I.U.D.  Other Topics Concern  . Not on file  Social History Narrative  . Not on file   Social Determinants of Health   Financial Resource Strain:   . Difficulty of Paying Living Expenses: Not on file  Food Insecurity:   . Worried About Charity fundraiser in the Last Year: Not on file  . Ran Out of Food in the Last Year: Not on file  Transportation Needs:   . Lack of Transportation (Medical): Not on file  . Lack of Transportation (Non-Medical): Not on file  Physical Activity:   . Days of Exercise per Week: Not on file  . Minutes of Exercise per Session: Not on file  Stress:   . Feeling of Stress : Not on file  Social Connections:   . Frequency of Communication with Friends and Family: Not on file  . Frequency of Social Gatherings with Friends and Family: Not on file  . Attends Religious Services: Not on file  . Active Member of  Clubs or Organizations: Not on file  . Attends Archivist Meetings: Not on file  . Marital Status: Not on file  Intimate Partner Violence:   . Fear of Current or Ex-Partner: Not on file  . Emotionally Abused: Not on file  . Physically Abused: Not on file  . Sexually Abused: Not on file    Allergies:  No Known Allergies  Medications:Physical Exam Vitals: BP 122/76   Pulse 71   Ht 5\' 7"  (1.702 m)   Wt 172 lb (78 kg)   LMP  (LMP Unknown)   BMI 26.94 kg/m   General: WF in NAD HEENT: normocephalic, anicteric Neck: no thyroid enlargement, no palpable nodules, no cervical lymphadenopathy  Pulmonary: No increased work of breathing, CTAB Cardiovascular: RRR, without murmur  Breast: Breast implants present, no tenderness, no palpable nodules  or masses, no skin or nipple retraction present, no nipple discharge.  No axillary, infraclavicular or supraclavicular lymphadenopathy. Abdomen: Soft, non-tender, non-distended.  Umbilicus without lesions.  No hepatomegaly or masses palpable. No evidence of hernia. Genitourinary:  External: Normal external female genitalia.  Normal urethral meatus, normal Bartholin's and Skene's glands.    Vagina: Normal vaginal mucosa, no evidence of prolapse, scant white discharge    Cervix: Grossly normal in appearance, no bleeding, non-tender, IUD strings present  Uterus: Anteflexed, normal size, shape, and consistency, mobile, and non-tender  Adnexa: No adnexal masses, non-tender  Rectal: deferred  Lymphatic: no evidence of inguinal lymphadenopathy Extremities: no edema, erythema, or tenderness Neurologic: Grossly intact Psychiatric: mood appropriate, affect full  Assessment: 54 y.o. annual gyn exam Anxiety-continue Lexapro-start taking in AM Insomnia- trial of Atarax for sleep  Plan:   1) Breast cancer screening - recommend monthly self breast exam. Screening mammogram ordered. To make appointment for after 07/03/2019  2) Colon cancer  screening: Colonoscopy negative in 2019. Next due in 2029  3) Cervical cancer screening - Pap was not  done. ASCCP guidelines and rational discussed.  Patient opts for every 3  year screening interval ([employer])  4) Contraception - Mirena now in place 7 years, she is amenorrheic, and her FSH and LH were in menopause range last year. -Recommended removing the IUD. See procedure note below.  5 Cholesterol and diabetic screening up to date  6) Refill of Lexapro to pharmacy.    Dalia Heading, CNM    History of Present Illness:  Claire Ramos is a 54 y.o. that had a Mirena IUD placed approximately 7 years ago. The IUD is expiring and she is now menopausal and desires the IUD to be removed.   Physical Exam:  BP 122/76   Pulse 71   Ht 5\' 7"  (1.702 m)   Wt 172 lb (78 kg)   LMP  (LMP Unknown)   BMI 26.94 kg/m  Body mass index is 26.94 kg/m.   Pelvic exam:  Two IUD strings present seen coming from the cervical os. EGBUS, vaginal vault and cervix: within normal limits  IUD Removal Strings of IUD identified and grasped.  IUD removed without problem.  Pt tolerated this well.  IUD noted to be intact.  Assessment: IUD Removal  Plan: IUD removed and plan for contraception is no method. She was amenable to this plan.  Dalia Heading, CNM

## 2020-05-22 DIAGNOSIS — F419 Anxiety disorder, unspecified: Secondary | ICD-10-CM | POA: Insufficient documentation

## 2020-05-22 DIAGNOSIS — G4700 Insomnia, unspecified: Secondary | ICD-10-CM | POA: Insufficient documentation

## 2020-07-07 ENCOUNTER — Other Ambulatory Visit: Payer: Self-pay | Admitting: Obstetrics and Gynecology

## 2020-07-07 ENCOUNTER — Telehealth: Payer: Self-pay

## 2020-07-07 DIAGNOSIS — Z1231 Encounter for screening mammogram for malignant neoplasm of breast: Secondary | ICD-10-CM

## 2020-07-07 NOTE — Telephone Encounter (Signed)
Pt calling; needs order for annual mammogram at [facility].  [phone]

## 2020-07-07 NOTE — Telephone Encounter (Signed)
Order placed. Pt can sched now

## 2020-07-07 NOTE — Progress Notes (Signed)
Scr mammo order placed. Due 10/21, last annual 8/21.

## 2020-07-07 NOTE — Telephone Encounter (Signed)
Pt aware.

## 2020-08-12 ENCOUNTER — Ambulatory Visit
Admission: RE | Admit: 2020-08-12 | Discharge: 2020-08-12 | Disposition: A | Discharge status: Home / Self Care | Payer: BC Managed Care – PPO | Source: Ambulatory Visit | Attending: Obstetrics and Gynecology | Admitting: Obstetrics and Gynecology

## 2020-08-12 ENCOUNTER — Other Ambulatory Visit: Payer: Self-pay

## 2020-08-12 DIAGNOSIS — Z1231 Encounter for screening mammogram for malignant neoplasm of breast: Secondary | ICD-10-CM | POA: Insufficient documentation

## 2020-08-18 ENCOUNTER — Encounter: Payer: Self-pay | Admitting: Obstetrics and Gynecology

## 2020-09-03 ENCOUNTER — Ambulatory Visit: Payer: BC Managed Care – PPO | Admitting: Dermatology

## 2020-09-03 ENCOUNTER — Other Ambulatory Visit: Payer: Self-pay

## 2020-09-03 DIAGNOSIS — Q825 Congenital non-neoplastic nevus: Secondary | ICD-10-CM

## 2020-09-03 DIAGNOSIS — L578 Other skin changes due to chronic exposure to nonionizing radiation: Secondary | ICD-10-CM

## 2020-09-03 DIAGNOSIS — D229 Melanocytic nevi, unspecified: Secondary | ICD-10-CM

## 2020-09-03 DIAGNOSIS — L821 Other seborrheic keratosis: Secondary | ICD-10-CM

## 2020-09-03 DIAGNOSIS — D1800 Hemangioma unspecified site: Secondary | ICD-10-CM

## 2020-09-03 DIAGNOSIS — L814 Other melanin hyperpigmentation: Secondary | ICD-10-CM | POA: Diagnosis not present

## 2020-09-03 DIAGNOSIS — Z1283 Encounter for screening for malignant neoplasm of skin: Secondary | ICD-10-CM

## 2020-09-03 NOTE — Patient Instructions (Addendum)
Recommend daily broad spectrum sunscreen SPF 30+ to sun-exposed areas, reapply every 2 hours as needed. Call for new or changing lesions.  Recommend sunscreen with titanium or zinc.     Recommend taking Heliocare sun protection supplement daily in sunny weather for additional sun protection. For maximum protection on the sunniest days, you can take up to 2 capsules of regular Heliocare OR take 1 capsule of Heliocare Ultra. For prolonged exposure (such as a full day in the sun), you can repeat your dose of the supplement 4 hours after your first dose. Heliocare can be purchased at [facility] or at [email or web address].     Melanoma ABCDEs  Melanoma is the most dangerous type of skin cancer, and is the leading cause of death from skin disease.  You are more likely to develop melanoma if you:  Have light-colored skin, light-colored eyes, or red or blond hair  Spend a lot of time in the sun  Tan regularly, either outdoors or in a tanning bed  Have had blistering sunburns, especially during childhood  Have a close family member who has had a melanoma  Have atypical moles or large birthmarks  Early detection of melanoma is key since treatment is typically straightforward and cure rates are extremely high if we catch it early.   The first sign of melanoma is often a change in a mole or a new dark spot.  The ABCDE system is a way of remembering the signs of melanoma.  A for asymmetry:  The two halves do not match. B for border:  The edges of the growth are irregular. C for color:  A mixture of colors are present instead of an even brown color. D for diameter:  Melanomas are usually (but not always) greater than 57mm - the size of a pencil eraser. E for evolution:  The spot keeps changing in size, shape, and color.  Please check your skin once per month between visits. You can use a small mirror in front and a large mirror behind you to keep an eye on the back side or your body.    If you see any new or changing lesions before your next follow-up, please call to schedule a visit.  Please continue daily skin protection including broad spectrum sunscreen SPF 30+ to sun-exposed areas, reapplying every 2 hours as needed when you're outdoors.

## 2020-09-03 NOTE — Progress Notes (Signed)
   New Patient Visit  Subjective  Claire Ramos is a 54 y.o. female who presents for the following: Annual Exam (No Hx of skin cancer. No Hx of dysplastic nevi. Pt states that she has a spot on her chest that comes and goes that it itchy occasionally. ).  Patient here for full body skin exam and skin cancer screening.  Objective  Well appearing patient in no apparent distress; mood and affect are within normal limits.  A full examination was performed including scalp, head, eyes, ears, nose, lips, neck, chest, axillae, abdomen, back, buttocks, bilateral upper extremities, bilateral lower extremities, hands, feet, fingers, toes, fingernails, and toenails. All findings within normal limits unless otherwise noted below.  Objective  Left pretibia: Tan-brown and/or pink-flesh-colored symmetric macules and papules.   Assessment & Plan  Congenital non-neoplastic nevus Left pretibia  Benign-appearing.  Observation.  Call clinic for new or changing moles.  Recommend daily use of broad spectrum spf 30+ sunscreen to sun-exposed areas.    Lentigines - Scattered tan macules - Discussed due to sun exposure - Benign, observe - Call for any changes  Seborrheic Keratoses - Stuck-on, waxy, tan-brown papules and plaques  - Discussed benign etiology and prognosis. - Observe - Call for any changes  Melanocytic Nevi - Tan-brown and/or pink-flesh-colored symmetric macules and papules - Benign appearing on exam today - Observation - Call clinic for new or changing moles - Recommend daily use of broad spectrum spf 30+ sunscreen to sun-exposed areas.   Hemangiomas - Red papules - Discussed benign nature - Observe - Call for any changes  Actinic Damage - Chronic, secondary to cumulative UV/sun exposure - diffuse scaly erythematous macules with underlying dyspigmentation - Recommend daily broad spectrum sunscreen SPF 30+ to sun-exposed areas, reapply every 2 hours as needed.  - Call for new or  changing lesions.  Skin cancer screening performed today.  Return in about 1 year (around 09/03/2021) for TBSE.   I, Harriett Sine, CMA, am acting as scribe for Forest Gleason, MD.  Documentation: I have reviewed the above documentation for accuracy and completeness, and I agree with the above.  Forest Gleason, MD

## 2020-09-23 ENCOUNTER — Encounter: Payer: Self-pay | Admitting: Dermatology

## 2021-03-19 ENCOUNTER — Encounter: Payer: Self-pay | Admitting: Emergency Medicine

## 2021-03-19 ENCOUNTER — Ambulatory Visit
Admission: EM | Admit: 2021-03-19 | Discharge: 2021-03-19 | Disposition: A | Discharge status: Home / Self Care | Payer: BC Managed Care – PPO | Attending: Sports Medicine | Admitting: Sports Medicine

## 2021-03-19 ENCOUNTER — Other Ambulatory Visit: Payer: Self-pay

## 2021-03-19 DIAGNOSIS — J069 Acute upper respiratory infection, unspecified: Secondary | ICD-10-CM

## 2021-03-19 DIAGNOSIS — R059 Cough, unspecified: Secondary | ICD-10-CM

## 2021-03-19 DIAGNOSIS — R0981 Nasal congestion: Secondary | ICD-10-CM

## 2021-03-19 DIAGNOSIS — R0989 Other specified symptoms and signs involving the circulatory and respiratory systems: Secondary | ICD-10-CM

## 2021-03-19 MED ORDER — PROMETHAZINE-DM 6.25-15 MG/5ML PO SYRP: 5.0000 mL | ORAL_SOLUTION | Freq: Four times a day (QID) | ORAL | 0 refills | Status: DC | PRN

## 2021-03-19 MED ORDER — ALBUTEROL SULFATE HFA 108 (90 BASE) MCG/ACT IN AERS: 1.0000 | INHALATION_SPRAY | Freq: Four times a day (QID) | RESPIRATORY_TRACT | 0 refills | Status: DC | PRN

## 2021-03-19 NOTE — ED Triage Notes (Signed)
Patient states that she was seen at a walk in clinic on Tuesday and had a negative covid test.  Patient states that she has been on Doxycycline currently.  Patient states that she still has a lot of chest congestion and cough. Patient denies recent fevers.

## 2021-03-19 NOTE — ED Provider Notes (Signed)
[facility]    CSN: [id number] Arrival date & time: 03/19/21  1816      History   Chief Complaint Chief Complaint  Patient presents with   Cough    HPI Claire Ramos is a 55 y.o. female.   Patient is a 55 year old female who presents for evaluation of the above issue.  She claims to not have a primary care provider.  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  She was down to [facility] 3 days ago and was seen in a walk-in clinic.  She had a negative COVID test.  Her symptoms are similar.  She was diagnosed with a upper respiratory infection with sinusitis and given doxycycline.  She has been taking it now for 3 days.  She is concerned because she has a lot of chest congestion and cough.  She feels as though she is having a hard time breaking up the mucus that is within her chest.  She denies any recent fever shakes chills.  No nausea vomiting or diarrhea.  She seems concerned because she is going to [place] tomorrow and she does not want to have a problem when she is out there.  She denies any substernal chest pain or shortness of breath.  No abdominal or urinary symptoms.  No red flag signs or symptoms elicited on history.   Past Medical History:  Diagnosis Date   Abnormal mammogram of left breast 10/2017   Birads 4 calcifications   Anxiety    Chronic kidney disease    KIDNEY STONE   Dysplastic nevus 01/10/2018   Right superior scapula. Moderate to severe atypia, close to margin.   Palpitations     Patient Active Problem List   Diagnosis Date Noted   Anxiety 05/22/2020   Insomnia 05/22/2020   Encounter for screening colonoscopy    Breast calcifications 05/16/2017   Gross hematuria 05/17/2015   Acute low back pain 05/17/2015    Past Surgical History:  Procedure Laterality Date   AUGMENTATION MAMMAPLASTY Bilateral 2003   BREAST BIOPSY Left 11/14/2017   benign   COLONOSCOPY WITH PROPOFOL N/A 04/23/2018   Procedure: COLONOSCOPY WITH  PROPOFOL;  Surgeon: Lucilla Lame, MD;  Location: [place];  Service: Endoscopy;  Laterality: N/A;   CYSTOSCOPY W/ RETROGRADES Bilateral 06/15/2015   Procedure: CYSTOSCOPY WITH RETROGRADE PYELOGRAM;  Surgeon: Festus Aloe, MD;  Location: [facility];  Service: Urology;  Laterality: Bilateral;   TONSILLECTOMY      OB History     Gravida  2   Para  2   Term  2   Preterm      AB      Living  2      SAB      IAB      Ectopic      Multiple      Live Births  2        Obstetric Comments  Partial third degree lac/MLE          Home Medications    Prior to Admission medications   Medication Sig Start Date End Date Taking? Authorizing Provider  albuterol (VENTOLIN HFA) 108 (90 Base) MCG/ACT inhaler Inhale 1-2 puffs into the lungs every 6 (six) hours as needed for wheezing or shortness of breath. 03/19/21  Yes Verda Cumins, MD  doxycycline (VIBRAMYCIN) 100 MG capsule Take 100 mg by mouth every 12 (twelve) hours. 05/13/20  Yes [provider]  escitalopram (LEXAPRO) 20 MG  tablet Take 1 tablet (20 mg total) by mouth daily. 05/21/20  Yes Dalia Heading, CNM  fluticasone (FLONASE) 50 MCG/ACT nasal spray Place 2 sprays into both nostrils daily.   Yes [provider]  promethazine-dextromethorphan (PROMETHAZINE-DM) 6.25-15 MG/5ML syrup Take 5 mLs by mouth 4 (four) times daily as needed for cough. 03/19/21  Yes Verda Cumins, MD  hydrOXYzine (ATARAX/VISTARIL) 25 MG tablet Take 1 tablet (25 mg total) by mouth every 6 (six) hours as needed for anxiety (or at hs for sleep). 05/21/20   Dalia Heading, CNM  Levonorgestrel (MIRENA, 52 MG, IU) 52 mg by Intrauterine route.     [provider]    Family History Family History  Problem Relation Age of Onset   Asthma Mother    Diabetes Mother    Hypertension Maternal Grandmother    Osteoarthritis Paternal Grandmother    Breast cancer Other    Kidney cancer Neg Hx    Kidney disease Neg Hx     Prostate cancer Neg Hx     Social History Social History   Tobacco Use   Smoking status: Never   Smokeless tobacco: Never  Vaping Use   Vaping Use: Never used  Substance Use Topics   Alcohol use: Yes    Alcohol/week: 0.0 standard drinks    Comment: socially - may have 1 drink/wk   Drug use: No     Allergies   Patient has no known allergies.   Review of Systems Review of Systems  Constitutional:  Negative for activity change, appetite change, chills, diaphoresis, fatigue and fever.  HENT:  Positive for congestion. Negative for ear pain, postnasal drip, rhinorrhea, sinus pressure, sinus pain, sneezing and sore throat.   Eyes:  Negative for pain.  Respiratory:  Positive for cough and chest tightness. Negative for shortness of breath and wheezing.   Cardiovascular:  Negative for chest pain and palpitations.  Gastrointestinal:  Negative for abdominal pain, diarrhea, nausea and vomiting.  Genitourinary:  Negative for dysuria.  Musculoskeletal:  Negative for back pain, myalgias and neck pain.  Skin:  Negative for color change, pallor, rash and wound.  Neurological:  Negative for dizziness, light-headedness and headaches.  All other systems reviewed and are negative.   Physical Exam Triage Vital Signs ED Triage Vitals  Enc Vitals Group     BP 03/19/21 1843 126/76     Pulse Rate 03/19/21 1843 63     Resp 03/19/21 1843 14     Temp 03/19/21 1843 98.1 F (36.7 C)     Temp Source 03/19/21 1843 Oral     SpO2 03/19/21 1843 100 %     Weight 03/19/21 1839 134 lb (60.8 kg)     Height 03/19/21 1839 5\' 5"  (1.651 m)     Head Circumference --      Peak Flow --      Pain Score 03/19/21 1839 0     Pain Loc --      Pain Edu? --      Excl. in [place]? --    No data found.  Updated Vital Signs BP 126/76 (BP Location: Left Arm)   Pulse 63   Temp 98.1 F (36.7 C) (Oral)   Resp 14   Ht 5\' 5"  (1.651 m)   Wt 60.8 kg   LMP  (LMP Unknown)   SpO2 100%   BMI 22.30 kg/m   Visual  Acuity Right Eye Distance:   Left Eye Distance:   Bilateral Distance:    Right Eye  Near:   Left Eye Near:    Bilateral Near:     Physical Exam Vitals and nursing note reviewed.  Constitutional:      General: She is not in acute distress.    Appearance: Normal appearance. She is not ill-appearing, toxic-appearing or diaphoretic.  HENT:     Head: Normocephalic and atraumatic.     Nose: Congestion present. No rhinorrhea.     Mouth/Throat:     Mouth: Mucous membranes are moist.  Eyes:     General: No scleral icterus.       Right eye: No discharge.        Left eye: No discharge.     Extraocular Movements: Extraocular movements intact.     Conjunctiva/sclera: Conjunctivae normal.     Pupils: Pupils are equal, round, and reactive to light.  Cardiovascular:     Rate and Rhythm: Normal rate and regular rhythm.     Pulses: Normal pulses.     Heart sounds: Normal heart sounds. No murmur heard.   No friction rub. No gallop.  Pulmonary:     Effort: Pulmonary effort is normal.     Breath sounds: Normal breath sounds. No stridor. No wheezing, rhonchi or rales.  Musculoskeletal:     Cervical back: Normal range of motion and neck supple. No rigidity or tenderness.  Skin:    General: Skin is warm and dry.     Capillary Refill: Capillary refill takes less than 2 seconds.     Coloration: Skin is not jaundiced.     Findings: No rash.  Neurological:     General: No focal deficit present.     Mental Status: She is alert and oriented to person, place, and time.     UC Treatments / Results  Labs (all labs ordered are listed, but only abnormal results are displayed) Labs Reviewed - No data to display  EKG   Radiology No results found.  Procedures Procedures (including critical care time)  Medications Ordered in UC Medications - No data to display  Initial Impression / Assessment and Plan / UC Course  I have reviewed the triage vital signs and the nursing notes.  Pertinent  labs & imaging results that were available during my care of the patient were reviewed by me and considered in my medical decision making (see chart for details).  Clinical impression: URI with cough, chest congestion, nasal congestion.  No current fevers.  Vital signs are reassuring.  Exam is actually reassuring.  Treatment plan: 1.  The findings and treatment plan were discussed in detail with the patient.  Patient was in agreement. 2.  Recommended she continue with the doxycycline as prescribed and finish that out. 3.  Vital signs are stable and O2 saturation was 100% on room air.  I did prescribe a albuterol inhaler even though she did not have a wheeze to just open up the lower bronchioles and help cough some of that perceived chest congestion up. 4.  I asked her to pick up Mucinex without the DM component.  Take 1200 mg twice a day to help thin secretions. 5.  I also prescribed promethazine DM to help with the cough. 6.  Educational handouts provided. 7.  Plenty of rest, plenty fluids, Tylenol or Motrin for any fever or discomfort. 8.  If symptoms persisted I have asked her to see her PCP.  If they are worse then she should go to the ER. 9.  She was stable on discharge and she will follow-up  here as needed.    Final Clinical Impressions(s) / UC Diagnoses   Final diagnoses:  Cough  Upper respiratory tract infection, unspecified type  Chest congestion  Nasal congestion     Discharge Instructions      As we discussed, your vital signs are stable.  You do not have a fever.  Your 100% on room air. I prescribed an albuterol inhaler to help open up the lower bronchioles and help you cough some of the stuff up. I recommend Mucinex without the DM component.  Max dose is 1200 mg twice a day.  This will help thin secretions. Please continue the doxycycline as prescribed and finish the entire course. I prescribed a cough medicine with the DM component you can use at night.  Try not to use  too much of it as I do not want to suppress the cough which may predispose you to a pneumonia. Please see educational handouts. Plenty of rest, plenty of fluids, Tylenol or Motrin for any fever or discomfort. If your symptoms persist please see your primary care provider. If your symptoms were to worsen in any way please go to the emergency room.     ED Prescriptions     Medication Sig Dispense Auth. Provider   albuterol (VENTOLIN HFA) 108 (90 Base) MCG/ACT inhaler Inhale 1-2 puffs into the lungs every 6 (six) hours as needed for wheezing or shortness of breath. 1 each Verda Cumins, MD   promethazine-dextromethorphan (PROMETHAZINE-DM) 6.25-15 MG/5ML syrup Take 5 mLs by mouth 4 (four) times daily as needed for cough. 180 mL Verda Cumins, MD      PDMP not reviewed this encounter.   Verda Cumins, MD 03/19/21 2021

## 2021-03-19 NOTE — Discharge Instructions (Addendum)
As we discussed, your vital signs are stable.  You do not have a fever.  Your 100% on room air. I prescribed an albuterol inhaler to help open up the lower bronchioles and help you cough some of the stuff up. I recommend Mucinex without the DM component.  Max dose is 1200 mg twice a day.  This will help thin secretions. Please continue the doxycycline as prescribed and finish the entire course. I prescribed a cough medicine with the DM component you can use at night.  Try not to use too much of it as I do not want to suppress the cough which may predispose you to a pneumonia. Please see educational handouts. Plenty of rest, plenty of fluids, Tylenol or Motrin for any fever or discomfort. If your symptoms persist please see your primary care provider. If your symptoms were to worsen in any way please go to the emergency room.

## 2021-05-24 ENCOUNTER — Encounter: Payer: Self-pay | Admitting: Obstetrics and Gynecology

## 2021-05-24 ENCOUNTER — Ambulatory Visit (INDEPENDENT_AMBULATORY_CARE_PROVIDER_SITE_OTHER): Payer: BC Managed Care – PPO | Admitting: Obstetrics and Gynecology

## 2021-05-24 ENCOUNTER — Other Ambulatory Visit: Payer: Self-pay

## 2021-05-24 VITALS — BP 100/60 | Ht 65.0 in | Wt 137.0 lb

## 2021-05-24 DIAGNOSIS — Z1231 Encounter for screening mammogram for malignant neoplasm of breast: Secondary | ICD-10-CM

## 2021-05-24 DIAGNOSIS — Z13220 Encounter for screening for lipoid disorders: Secondary | ICD-10-CM

## 2021-05-24 DIAGNOSIS — N951 Menopausal and female climacteric states: Secondary | ICD-10-CM

## 2021-05-24 DIAGNOSIS — F419 Anxiety disorder, unspecified: Secondary | ICD-10-CM | POA: Diagnosis not present

## 2021-05-24 DIAGNOSIS — Z0000 Encounter for general adult medical examination without abnormal findings: Secondary | ICD-10-CM

## 2021-05-24 DIAGNOSIS — Z01419 Encounter for gynecological examination (general) (routine) without abnormal findings: Secondary | ICD-10-CM

## 2021-05-24 DIAGNOSIS — M25559 Pain in unspecified hip: Secondary | ICD-10-CM

## 2021-05-24 MED ORDER — ESCITALOPRAM OXALATE 20 MG PO TABS: 20.0000 mg | ORAL_TABLET | Freq: Every day | ORAL | 3 refills | Status: DC

## 2021-05-24 NOTE — Progress Notes (Signed)
PCP: Dalia Heading, CNM (Inactive)   Chief Complaint  Patient presents with   Gynecologic Exam    No concerns    HPI:      Claire Ramos is a 55 y.o. [phone] whose LMP was No LMP recorded (lmp unknown). Patient is postmenopausal., presents today for her annual examination.  Her menses are absent due to menopause. IUD removed last yr due to expiration and no bleeding since. Has tolerable vasomotor sx.   Sex activity: single partner, contraception - post menopausal status. She does not have vaginal dryness.  Last Pap: 05/15/19 Results were: no abnormalities /neg HPV DNA 2019.   Last mammogram: 08/12/20   Results were: normal--routine follow-up in 12 months. Hx of benign bx for LT breast calcifications 2019.  There is no FH of breast cancer. There is no FH of ovarian cancer. The patient does do self-breast exams.  Colonoscopy: 2019 with Dr. Allen Norris, no bx done;  Repeat due after 10 years.   Tobacco use: The patient denies current or previous tobacco use. Alcohol use: none Exercise: moderately active  She does get adequate calcium but not Vitamin D in her diet.  Normal labs with [place] 8/20, repeat due next yr. Takes lexapro for anxiety with sx relief. Needs Rx RF.  Has been having intermittent LT hip pain, shoots down leg. Sometimes hard to stand up. Not sure where to go for eval.    Past Medical History:  Diagnosis Date   Abnormal mammogram of left breast 10/2017   Birads 4 calcifications   Anxiety    Chronic kidney disease    KIDNEY STONE   Dysplastic nevus 01/10/2018   Right superior scapula. Moderate to severe atypia, close to margin.   Palpitations     Past Surgical History:  Procedure Laterality Date   AUGMENTATION MAMMAPLASTY Bilateral 2003   BREAST BIOPSY Left 11/14/2017   benign   COLONOSCOPY WITH PROPOFOL N/A 04/23/2018   Procedure: COLONOSCOPY WITH PROPOFOL;  Surgeon: Lucilla Lame, MD;  Location: [place];  Service: Endoscopy;   Laterality: N/A;   CYSTOSCOPY W/ RETROGRADES Bilateral 06/15/2015   Procedure: CYSTOSCOPY WITH RETROGRADE PYELOGRAM;  Surgeon: Festus Aloe, MD;  Location: [facility];  Service: Urology;  Laterality: Bilateral;   TONSILLECTOMY      Family History  Problem Relation Age of Onset   Asthma Mother    Diabetes Mother    Hypertension Maternal Grandmother    Osteoarthritis Paternal Grandmother    Breast cancer Other    Kidney cancer Neg Hx    Kidney disease Neg Hx    Prostate cancer Neg Hx     Social History   Socioeconomic History   Marital status: Married    Spouse name: Not on file   Number of children: 2   Years of education: Not on file   Highest education level: Not on file  Occupational History   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  Tobacco Use   Smoking status: Never   Smokeless tobacco: Never  Vaping Use   Vaping Use: Never used  Substance and Sexual Activity   Alcohol use: Yes    Alcohol/week: 0.0 standard drinks    Comment: socially - may have 1 drink/wk   Drug use: No   Sexual activity: Yes    Birth control/protection: None  Other Topics Concern   Not on file  Social History Narrative   Not on file   Social Determinants of Health   Financial Resource Strain: Not on file  Food  Insecurity: Not on file  Transportation Needs: Not on file  Physical Activity: Not on file  Stress: Not on file  Social Connections: Not on file  Intimate Partner Violence: Not on file     Current Outpatient Medications:    escitalopram (LEXAPRO) 20 MG tablet, Take 1 tablet (20 mg total) by mouth daily., Disp: 90 tablet, Rfl: 3     ROS:  Review of Systems  Constitutional:  Negative for fatigue, fever and unexpected weight change.  Respiratory:  Negative for cough, shortness of breath and wheezing.   Cardiovascular:  Negative for chest pain, palpitations and leg swelling.  Gastrointestinal:  Negative for blood in stool, constipation, diarrhea, nausea and vomiting.   Endocrine: Negative for cold intolerance, heat intolerance and polyuria.  Genitourinary:  Negative for dyspareunia, dysuria, flank pain, frequency, genital sores, hematuria, menstrual problem, pelvic pain, urgency, vaginal bleeding, vaginal discharge and vaginal pain.  Musculoskeletal:  Positive for arthralgias. Negative for back pain, joint swelling and myalgias.  Skin:  Negative for rash.  Neurological:  Positive for headaches. Negative for dizziness, syncope, light-headedness and numbness.  Hematological:  Negative for adenopathy.  Psychiatric/Behavioral:  Positive for agitation. Negative for confusion, sleep disturbance and suicidal ideas. The patient is not nervous/anxious.   BREAST: No symptoms    Objective: BP 100/60   Ht '5\' 5"'$  (1.651 m)   Wt 137 lb (62.1 kg)   LMP  (LMP Unknown)   BMI 22.80 kg/m    Physical Exam Constitutional:      Appearance: She is well-developed.  Genitourinary:     Vulva normal.     Right Labia: No rash, tenderness or lesions.    Left Labia: No tenderness, lesions or rash.    No vaginal discharge, erythema or tenderness.      Right Adnexa: not tender and no mass present.    Left Adnexa: not tender and no mass present.    No cervical friability or polyp.     Uterus is not enlarged or tender.  Breasts:    Right: No mass, nipple discharge, skin change or tenderness.     Left: No mass, nipple discharge, skin change or tenderness.  Neck:     Thyroid: No thyromegaly.  Cardiovascular:     Rate and Rhythm: Normal rate and regular rhythm.     Heart sounds: Normal heart sounds. No murmur heard. Pulmonary:     Effort: Pulmonary effort is normal.     Breath sounds: Normal breath sounds.  Abdominal:     Palpations: Abdomen is soft.     Tenderness: There is no abdominal tenderness. There is no guarding or rebound.  Musculoskeletal:        General: Normal range of motion.     Cervical back: Normal range of motion.  Lymphadenopathy:     Cervical: No  cervical adenopathy.  Neurological:     General: No focal deficit present.     Mental Status: She is alert and oriented to person, place, and time.     Cranial Nerves: No cranial nerve deficit.  Skin:    General: Skin is warm and dry.  Psychiatric:        Mood and Affect: Mood normal.        Behavior: Behavior normal.        Thought Content: Thought content normal.        Judgment: Judgment normal.  Vitals reviewed.    Assessment/Plan:  Encounter for annual routine gynecological examination  Encounter for screening mammogram for  malignant neoplasm of breast - Plan: MM 3D SCREEN BREAST BILATERAL; pt to sched mammo  Anxiety - Plan: escitalopram (LEXAPRO) 20 MG tablet; Rx RF, doing well  Vasomotor symptoms due to menopause; try estroven. F/u prn.   Hip pain--recommended seeing [facility] for further eval.   Meds ordered this encounter  Medications   escitalopram (LEXAPRO) 20 MG tablet    Sig: Take 1 tablet (20 mg total) by mouth daily.    Dispense:  90 tablet    Refill:  3    Order Specific Question:   Supervising Provider    Answer:   Gae Dry [id number]            GYN counsel breast self exam, mammography screening, menopause, adequate intake of calcium and vitamin D, diet and exercise    F/U  Return in about 1 year (around 05/24/2022).  Brynda Heick B. Cruzita Lipa, PA-C 05/25/2021 9:23 AM

## 2021-05-24 NOTE — Patient Instructions (Signed)
I value your feedback and you entrusting us with your care. If you get a [facility] patient survey, I would appreciate you taking the time to let us know about your experience today. Thank you!  [facility]: [phone]      

## 2021-05-25 DIAGNOSIS — N951 Menopausal and female climacteric states: Secondary | ICD-10-CM | POA: Insufficient documentation

## 2021-08-13 ENCOUNTER — Ambulatory Visit
Admission: RE | Admit: 2021-08-13 | Discharge: 2021-08-13 | Disposition: A | Discharge status: Home / Self Care | Payer: BC Managed Care – PPO | Source: Ambulatory Visit | Attending: Obstetrics and Gynecology | Admitting: Obstetrics and Gynecology

## 2021-08-13 ENCOUNTER — Other Ambulatory Visit: Payer: Self-pay

## 2021-08-13 DIAGNOSIS — Z1231 Encounter for screening mammogram for malignant neoplasm of breast: Secondary | ICD-10-CM | POA: Diagnosis present

## 2021-08-16 ENCOUNTER — Encounter: Payer: Self-pay | Admitting: Obstetrics and Gynecology

## 2021-08-26 ENCOUNTER — Telehealth: Payer: Self-pay

## 2021-08-26 NOTE — Telephone Encounter (Signed)
Mammogram was normal. Letter sent on 08/16/21 but I don't know why she hasn't received it.

## 2021-08-26 NOTE — Telephone Encounter (Signed)
Pt aware. Address on letter sent correct.

## 2021-08-26 NOTE — Telephone Encounter (Signed)
Inquiring about mammgram results from 2 weeks ago. She hasn't received a letter in the mail or a call. [phone]

## 2021-09-08 ENCOUNTER — Ambulatory Visit: Payer: BC Managed Care – PPO | Admitting: Dermatology

## 2021-09-08 ENCOUNTER — Other Ambulatory Visit: Payer: Self-pay

## 2021-09-08 DIAGNOSIS — Q825 Congenital non-neoplastic nevus: Secondary | ICD-10-CM

## 2021-09-08 DIAGNOSIS — D2271 Melanocytic nevi of right lower limb, including hip: Secondary | ICD-10-CM

## 2021-09-08 DIAGNOSIS — L738 Other specified follicular disorders: Secondary | ICD-10-CM

## 2021-09-08 DIAGNOSIS — D229 Melanocytic nevi, unspecified: Secondary | ICD-10-CM

## 2021-09-08 DIAGNOSIS — L578 Other skin changes due to chronic exposure to nonionizing radiation: Secondary | ICD-10-CM

## 2021-09-08 DIAGNOSIS — Z1283 Encounter for screening for malignant neoplasm of skin: Secondary | ICD-10-CM | POA: Diagnosis not present

## 2021-09-08 DIAGNOSIS — L814 Other melanin hyperpigmentation: Secondary | ICD-10-CM

## 2021-09-08 DIAGNOSIS — L821 Other seborrheic keratosis: Secondary | ICD-10-CM

## 2021-09-08 DIAGNOSIS — L853 Xerosis cutis: Secondary | ICD-10-CM

## 2021-09-08 DIAGNOSIS — D1800 Hemangioma unspecified site: Secondary | ICD-10-CM

## 2021-09-08 NOTE — Progress Notes (Signed)
° °  Follow-Up Visit   Subjective  Claire Ramos is a 55 y.o. female who presents for the following: Follow-up (Patient here today for 1 year tbse. Patient reports a spot at back of left lower leg she would like checked patient reports noticing over summer. Patient reports a spot at right shoulder she would like checked. Patient reports some reds spots at breast. ).  Right anterior thigh nevus 0.4 cm medium brown thin papule with slightly darker central focus   Patient here for full body skin exam and skin cancer screening.  The following portions of the chart were reviewed this encounter and updated as appropriate:  Tobacco   Allergies   Meds   Problems   Med Hx   Surg Hx   Fam Hx       Review of Systems: No other skin or systemic complaints except as noted in HPI or Assessment and Plan.   Objective  Well appearing patient in no apparent distress; mood and affect are within normal limits.  A full examination was performed including scalp, head, eyes, ears, nose, lips, neck, chest, axillae, abdomen, back, buttocks, bilateral upper extremities, bilateral lower extremities, hands, feet, fingers, toes, fingernails, and toenails. All findings within normal limits unless otherwise noted below.  left pretibia Brown plaque   Right Thigh - Anterior 0.4 cm medium brown thin papule with slightly darker central focus    Assessment & Plan  Congenital non-neoplastic nevus left pretibia  Benign-appearing.  Observation.  Call clinic for new or changing lesions.  Recommend daily use of broad spectrum spf 30+ sunscreen to sun-exposed areas.    Nevus Right Thigh - Anterior  Benign-appearing.  Observation.  Call clinic for new or changing lesions.  Recommend daily use of broad spectrum spf 30+ sunscreen to sun-exposed areas.    Lentigines - Scattered tan macules - Due to sun exposure - Benign-appearing, observe - Recommend daily broad spectrum sunscreen SPF 30+ to sun-exposed areas, reapply  every 2 hours as needed. - Call for any changes  Sebaceous Hyperplasia - Small yellow papules with a central dell - Benign - Observe  Seborrheic Keratoses - Stuck-on, waxy, tan-brown papules and/or plaques  - Benign-appearing - Discussed benign etiology and prognosis. - Observe - Call for any changes  Melanocytic Nevi - Tan-brown and/or pink-flesh-colored symmetric macules and papules - Benign appearing on exam today - Observation - Call clinic for new or changing moles - Recommend daily use of broad spectrum spf 30+ sunscreen to sun-exposed areas.   Xerosis - diffuse xerotic patches - recommend gentle, hydrating skin care - gentle skin care handout given  Hemangiomas - Red papules at breasts  - Discussed benign nature - Observe - Call for any changes  Actinic Damage - Chronic condition, secondary to cumulative UV/sun exposure - diffuse scaly erythematous macules with underlying dyspigmentation - Recommend daily broad spectrum sunscreen SPF 30+ to sun-exposed areas, reapply every 2 hours as needed.  - Staying in the shade or wearing long sleeves, sun glasses (UVA+UVB protection) and wide brim hats (4-inch brim around the entire circumference of the hat) are also recommended for sun protection.  - Call for new or changing lesions.  Skin cancer screening performed today.  Return for 1 year tbse. I, Ruthell Rummage, CMA, am acting as scribe for Forest Gleason, MD.  Documentation: I have reviewed the above documentation for accuracy and completeness, and I agree with the above.  Forest Gleason, MD

## 2021-09-08 NOTE — Patient Instructions (Addendum)
Gentle Skin Care Guide  1. Bathe no more than once a day.  2. Avoid bathing in hot water  3. Use a mild soap like Dove, Vanicream, Cetaphil, CeraVe. Can use Lever 2000 or Cetaphil antibacterial soap  4. Use soap only where you need it. On most days, use it under your arms, between your legs, and on your feet. Let the water rinse other areas unless visibly dirty.  5. When you get out of the bath/shower, use a towel to gently blot your skin dry, don't rub it.  6. While your skin is still a little damp, apply a moisturizing cream such as Vanicream, CeraVe, Cetaphil, Eucerin, Sarna lotion or plain Vaseline Jelly. For hands apply Neutrogena [place] Hand Cream or Excipial Hand Cream.  7. Reapply moisturizer any time you start to itch or feel dry.  8. Sometimes using free and clear laundry detergents can be helpful. Fabric softener sheets should be avoided. Downy Free & Gentle liquid, or any liquid fabric softener that is free of dyes and perfumes, it acceptable to use  9. If your doctor has given you prescription creams you may apply moisturizers over them   Recommend taking Heliocare sun protection supplement daily in sunny weather for additional sun protection. For maximum protection on the sunniest days, you can take up to 2 capsules of regular Heliocare OR take 1 capsule of Heliocare Ultra. For prolonged exposure (such as a full day in the sun), you can repeat your dose of the supplement 4 hours after your first dose. Heliocare can be purchased at [facility] or at [email or web address].      Melanoma ABCDEs  Melanoma is the most dangerous type of skin cancer, and is the leading cause of death from skin disease.  You are more likely to develop melanoma if you: Have light-colored skin, light-colored eyes, or red or blond hair Spend a lot of time in the sun Tan regularly, either outdoors or in a tanning bed Have had blistering sunburns, especially during childhood Have a close  family member who has had a melanoma Have atypical moles or large birthmarks  Early detection of melanoma is key since treatment is typically straightforward and cure rates are extremely high if we catch it early.   The first sign of melanoma is often a change in a mole or a new dark spot.  The ABCDE system is a way of remembering the signs of melanoma.  A for asymmetry:  The two halves do not match. B for border:  The edges of the growth are irregular. C for color:  A mixture of colors are present instead of an even brown color. D for diameter:  Melanomas are usually (but not always) greater than 3mm - the size of a pencil eraser. E for evolution:  The spot keeps changing in size, shape, and color.  Please check your skin once per month between visits. You can use a small mirror in front and a large mirror behind you to keep an eye on the back side or your body.   If you see any new or changing lesions before your next follow-up, please call to schedule a visit.  Please continue daily skin protection including broad spectrum sunscreen SPF 30+ to sun-exposed areas, reapplying every 2 hours as needed when you're outdoors.   Staying in the shade or wearing long sleeves, sun glasses (UVA+UVB protection) and wide brim hats (4-inch brim around the entire circumference of the hat) are also recommended for sun protection.  If You Need Anything After Your Visit  If you have any questions or concerns for your doctor, please call our main line at [phone] and press option 4 to reach your doctor's medical assistant. If no one answers, please leave a voicemail as directed and we will return your call as soon as possible. Messages left after 4 pm will be answered the following business day.   You may also send [place] a message via [place]. We typically respond to MyChart messages within 1-2 business days.  For prescription refills, please ask your pharmacy to contact our office. Our fax number is  [phone].  If you have an urgent issue when the clinic is closed that cannot wait until the next business day, you can page your doctor at the number below.    Please note that while we do our best to be available for urgent issues outside of office hours, we are not available 24/7.   If you have an urgent issue and are unable to reach [place], you may choose to seek medical care at your doctor's office, retail clinic, urgent care center, or emergency room.  If you have a medical emergency, please immediately call 911 or go to the emergency department.  Pager Numbers  - Dr. Nehemiah Massed: [phone]  - Dr. Laurence Ferrari: [phone]  - Dr. Nicole Kindred: [phone]  In the event of inclement weather, please call our main line at [phone] for an update on the status of any delays or closures.  Dermatology Medication Tips: Please keep the boxes that topical medications come in in order to help keep track of the instructions about where and how to use these. Pharmacies typically print the medication instructions only on the boxes and not directly on the medication tubes.   If your medication is too expensive, please contact our office at [phone] option 4 or send [place] a message through [place].   We are unable to tell what your co-pay for medications will be in advance as this is different depending on your insurance coverage. However, we may be able to find a substitute medication at lower cost or fill out paperwork to get insurance to cover a needed medication.   If a prior authorization is required to get your medication covered by your insurance company, please allow [place] 1-2 business days to complete this process.  Drug prices often vary depending on where the prescription is filled and some pharmacies may offer cheaper prices.  The website www.goodrx.com contains coupons for medications through different pharmacies. The prices here do not account for what the cost may be with help from  insurance (it may be cheaper with your insurance), but the website can give you the price if you did not use any insurance.  - You can print the associated coupon and take it with your prescription to the pharmacy.  - You may also stop by our office during regular business hours and pick up a GoodRx coupon card.  - If you need your prescription sent electronically to a different pharmacy, notify our office through [facility] or by phone at [phone] option 4.     Si Usted Necesita Algo Despus de Su Visita  Tambin puede enviarnos un mensaje a travs de Pharmacist, community. Por lo general respondemos a los mensajes de MyChart en el transcurso de 1 a 2 das hbiles.  Para renovar recetas, por favor pida a su farmacia que se ponga en contacto con nuestra oficina. Harland Dingwall de fax es [place] [phone].  Si  tiene un asunto urgente cuando la clnica est cerrada y que no puede esperar hasta el siguiente da hbil, puede llamar/localizar a su doctor(a) al nmero que aparece a continuacin.   Por favor, tenga en cuenta que aunque hacemos todo lo posible para estar disponibles para asuntos urgentes fuera del horario de [place], no estamos disponibles las 24 horas del da, los 7 das de la [place].   Si tiene un problema urgente y no puede comunicarse con nosotros, puede optar por buscar atencin mdica  en el consultorio de su doctor(a), en una clnica privada, en un centro de atencin urgente o en una sala de emergencias.  Si tiene Engineering geologist, por favor llame inmediatamente al 911 o vaya a la sala de emergencias.  Nmeros de bper  - Dr. Nehemiah Massed: [phone]  - Dra. Moye: [phone]  - Dra. Nicole Kindred: [phone]  En caso de inclemencias del [place], por favor llame a Johnsie Kindred principal al [phone] para una actualizacin sobre el [place] de cualquier retraso o cierre.  Consejos para la medicacin en dermatologa: Por favor, guarde las cajas en las que vienen los  medicamentos de uso tpico para ayudarle a seguir las instrucciones sobre dnde y cmo usarlos. Las farmacias generalmente imprimen las instrucciones del medicamento slo en las cajas y no directamente en los tubos del [place].   Si su medicamento es muy caro, por favor, pngase en contacto con Zigmund Daniel llamando al [phone] y presione la opcin 4 o envenos un mensaje a travs de Pharmacist, community.   No podemos decirle cul ser su copago por los medicamentos por adelantado ya que esto es diferente dependiendo de la cobertura de su seguro. Sin embargo, es posible que podamos encontrar un medicamento sustituto a Electrical engineer un formulario para que el seguro cubra el medicamento que se considera necesario.   Si se requiere una autorizacin previa para que su compaa de seguros [place] su medicamento, por favor permtanos de 1 a 2 das hbiles para completar este proceso.  Los precios de los medicamentos varan con frecuencia dependiendo del Environmental consultant de dnde se surte la receta y alguna farmacias pueden ofrecer precios ms baratos.  El sitio web www.goodrx.com tiene cupones para medicamentos de Airline pilot. Los precios aqu no tienen en cuenta lo que podra costar con la ayuda del seguro (puede ser ms barato con su seguro), pero el sitio web puede darle el precio si no utiliz Research scientist (physical sciences).  - Puede imprimir el cupn correspondiente y llevarlo con su receta a la farmacia.  - Tambin puede pasar por nuestra oficina durante el horario de atencin regular y Charity fundraiser una tarjeta de cupones de GoodRx.  - Si necesita que su receta se enve electrnicamente a una farmacia diferente, informe a nuestra oficina a travs de MyChart de [place] o por telfono llamando al [phone] y presione la opcin 4.

## 2021-09-14 ENCOUNTER — Encounter: Payer: Self-pay | Admitting: Dermatology

## 2022-02-08 ENCOUNTER — Encounter: Payer: Self-pay | Admitting: Dermatology

## 2022-02-08 ENCOUNTER — Ambulatory Visit: Payer: BC Managed Care – PPO | Admitting: Dermatology

## 2022-02-08 DIAGNOSIS — D692 Other nonthrombocytopenic purpura: Secondary | ICD-10-CM | POA: Diagnosis not present

## 2022-02-08 NOTE — Progress Notes (Signed)
? ?  Follow-Up Visit ?  ?Subjective  ?Claire Ramos is a 56 y.o. female who presents for the following: Nevus (Left plantar foot. Noticed ~2 weeks ago. No changes since noticed). ? ?The patient has spots, moles and lesions to be evaluated, some may be new or changing and the patient has concerns that these could be cancer. ? ? ?The following portions of the chart were reviewed this encounter and updated as appropriate:   ?  ? ?Review of Systems: No other skin or systemic complaints except as noted in HPI or Assessment and Plan. ? ? ?Objective  ?Well appearing patient in no apparent distress; mood and affect are within normal limits. ? ?A focused examination was performed including face, legs, feet. Relevant physical exam findings are noted in the Assessment and Plan. ? ?Left Middle Plantar Surface ?0.1 cm dark red-brown macule, Appears as hemorrhage under dermoscopy.  ? ? ? ? ? ?Assessment & Plan  ?Purpura ([place]) ?Left Middle Plantar Surface ? ?Benign, observe.   ? ?Likely due to trauma- Will self-resolve ? ? ? ? ?Return for 1 month recheck foot, TBSE As Scheduled. ? ? ?I, Emelia Salisbury, CMA, am acting as scribe for Brendolyn Patty, MD. ? ?Documentation: I have reviewed the above documentation for accuracy and completeness, and I agree with the above. ? ?Brendolyn Patty MD  ? ?

## 2022-02-08 NOTE — Patient Instructions (Addendum)
Recommend daily broad spectrum sunscreen SPF 30+ to sun-exposed areas, reapply every 2 hours as needed. Call for new or changing lesions.  ?Staying in the shade or wearing long sleeves, sun glasses (UVA+UVB protection) and wide brim hats (4-inch brim around the entire circumference of the hat) are also recommended for sun protection.  ? ? ?If You Need Anything After Your Visit ? ?If you have any questions or concerns for your doctor, please call our main line at [phone] and press option 4 to reach your doctor's medical assistant. If no one answers, please leave a voicemail as directed and we will return your call as soon as possible. Messages left after 4 pm will be answered the following business day.  ? ?You may also send [place] a message via MyChart. We typically respond to MyChart messages within 1-2 business days. ? ?For prescription refills, please ask your pharmacy to contact our office. Our fax number is [phone]. ? ?If you have an urgent issue when the clinic is closed that cannot wait until the next business day, you can page your doctor at the number below.   ? ?Please note that while we do our best to be available for urgent issues outside of office hours, we are not available 24/7.  ? ?If you have an urgent issue and are unable to reach [place], you may choose to seek medical care at your doctor's office, retail clinic, urgent care center, or emergency room. ? ?If you have a medical emergency, please immediately call 911 or go to the emergency department. ? ?Pager Numbers ? ?- Dr. Nehemiah Massed: [phone] ? ?- Dr. Laurence Ferrari: [phone] ? ?- Dr. Nicole Kindred: [phone] ? ?In the event of inclement weather, please call our main line at [phone] for an update on the status of any delays or closures. ? ?Dermatology Medication Tips: ?Please keep the boxes that topical medications come in in order to help keep track of the instructions about where and how to use these. Pharmacies typically print the medication  instructions only on the boxes and not directly on the medication tubes.  ? ?If your medication is too expensive, please contact our office at [phone] option 4 or send [place] a message through [place].  ? ?We are unable to tell what your co-pay for medications will be in advance as this is different depending on your insurance coverage. However, we may be able to find a substitute medication at lower cost or fill out paperwork to get insurance to cover a needed medication.  ? ?If a prior authorization is required to get your medication covered by your insurance company, please allow [place] 1-2 business days to complete this process. ? ?Drug prices often vary depending on where the prescription is filled and some pharmacies may offer cheaper prices. ? ?The website www.goodrx.com contains coupons for medications through different pharmacies. The prices here do not account for what the cost may be with help from insurance (it may be cheaper with your insurance), but the website can give you the price if you did not use any insurance.  ?- You can print the associated coupon and take it with your prescription to the pharmacy.  ?- You may also stop by our office during regular business hours and pick up a GoodRx coupon card.  ?- If you need your prescription sent electronically to a different pharmacy, notify our office through [facility] or by phone at [phone] option 4. ? ? ? ? ?Si Usted Necesita Algo Despu?s de Su  Visita ? ?Tambi?n puede enviarnos un mensaje a trav?s de MyChart. Por lo general respondemos a los mensajes de MyChart en el transcurso de 1 a 2 d?as h?biles. ? ?Para renovar recetas, por favor pida a su farmacia que se ponga en contacto con nuestra oficina. Nuestro n?mero de fax es el [phone]. ? ?Si tiene un asunto urgente cuando la cl?nica est? cerrada y que no puede esperar hasta el siguiente d?a h?bil, puede llamar/localizar a su doctor(a) al n?mero que aparece a continuaci?n.  ? ?Por  favor, tenga en cuenta que aunque hacemos todo lo posible para estar disponibles para asuntos urgentes fuera del horario de oficina, no estamos disponibles las 24 horas del d?a, los 7 d?as de la semana.  ? ?Si tiene un problema urgente y no puede comunicarse con nosotros, puede optar por buscar atenci?n m?dica  en el consultorio de su doctor(a), en una cl?nica privada, en un centro de atenci?n urgente o en una sala de emergencias. ? ?Si tiene Engineer, maintenance (IT) m?dica, por favor llame inmediatamente al 911 o vaya a la sala de emergencias. ? ?N?meros de b?per ? ?- Dr. Nehemiah Massed: [phone] ? ?- Dra. Moye: [phone] ? ?- Dra. Nicole Kindred: [phone] ? ?En caso de inclemencias del tiempo, por favor llame a nuestra l?nea principal al [phone] para una actualizaci?n sobre el estado de cualquier retraso o cierre. ? ?Consejos para la medicaci?n en dermatolog?a: ?Por favor, guarde las cajas en las que vienen los medicamentos de uso t?pico para ayudarle a seguir las instrucciones sobre d?nde y c?mo usarlos. Las farmacias generalmente imprimen las instrucciones del medicamento s?lo en las cajas y no directamente en los tubos del [place].  ? ?Si su medicamento es muy caro, por favor, p?ngase en contacto con Zigmund Daniel llamando al [phone] y presione la opci?n 4 o env?enos un mensaje a trav?s de MyChart.  ? ?No podemos decirle cu?l ser? su copago por los medicamentos por adelantado ya que esto es diferente dependiendo de la cobertura de su seguro. Sin embargo, es posible que podamos encontrar un medicamento sustituto a Electrical engineer un formulario para que el seguro cubra el medicamento que se considera necesario.  ? ?Si se requiere Ardelia Mems autorizaci?n previa para que su compa??a de seguros [place] su medicamento, por favor perm?tanos de 1 a 2 d?as h?biles para completar este proceso. ? ?Los precios de los medicamentos var?an con frecuencia dependiendo del Environmental consultant de d?nde se surte la receta y alguna farmacias  pueden ofrecer precios m?s baratos. ? ?El sitio web www.goodrx.com tiene cupones para medicamentos de Airline pilot. Los precios aqu? no tienen en cuenta lo que podr?a costar con la ayuda del seguro (puede ser m?s barato con su seguro), pero el sitio web puede darle el precio si no utiliz? ning?n seguro.  ?- Puede imprimir el cup?n correspondiente y llevarlo con su receta a la farmacia.  ?- Tambi?n puede pasar por nuestra oficina durante el horario de atenci?n regular y recoger una tarjeta de cupones de GoodRx.  ?- Si necesita que su receta se env?e electr?nicamente a Chiropodist, informe a nuestra oficina a trav?s de MyChart de [place] o por tel?fono llamando al [phone] y presione la opci?n 4.  ?

## 2022-03-14 ENCOUNTER — Ambulatory Visit: Payer: BC Managed Care – PPO | Admitting: Dermatology

## 2022-06-09 ENCOUNTER — Other Ambulatory Visit: Payer: Self-pay | Admitting: Obstetrics and Gynecology

## 2022-06-09 DIAGNOSIS — F419 Anxiety disorder, unspecified: Secondary | ICD-10-CM

## 2022-07-03 ENCOUNTER — Other Ambulatory Visit: Payer: Self-pay | Admitting: Obstetrics and Gynecology

## 2022-07-03 DIAGNOSIS — F419 Anxiety disorder, unspecified: Secondary | ICD-10-CM

## 2022-07-04 ENCOUNTER — Telehealth: Payer: Self-pay

## 2022-07-04 ENCOUNTER — Other Ambulatory Visit: Payer: Self-pay

## 2022-07-04 DIAGNOSIS — F419 Anxiety disorder, unspecified: Secondary | ICD-10-CM

## 2022-07-04 MED ORDER — ESCITALOPRAM OXALATE 20 MG PO TABS: 20.0000 mg | ORAL_TABLET | Freq: Every day | ORAL | 0 refills | Status: DC

## 2022-07-04 NOTE — Telephone Encounter (Signed)
Patient called stating she needed a refill of her Lexapro and she was willing to do whatever she needed to do to get her medicine.   Told her I can send in one refill but she must schedule and keep the appointment or she wouldn't get anymore until she's seen in office.  Patient agreed.

## 2022-07-06 ENCOUNTER — Other Ambulatory Visit: Payer: Self-pay | Admitting: Obstetrics and Gynecology

## 2022-07-06 DIAGNOSIS — Z1231 Encounter for screening mammogram for malignant neoplasm of breast: Secondary | ICD-10-CM

## 2022-07-30 ENCOUNTER — Other Ambulatory Visit: Payer: Self-pay | Admitting: Obstetrics and Gynecology

## 2022-07-30 DIAGNOSIS — F419 Anxiety disorder, unspecified: Secondary | ICD-10-CM

## 2022-08-14 NOTE — Progress Notes (Unsigned)
PCP: Dalia Heading, CNM (Inactive)   No chief complaint on file.   HPI:      [name] is a 56 y.o. [phone] whose LMP was No LMP recorded (lmp unknown). Patient is postmenopausal., presents today for her annual examination.  Her menses are absent due to menopause. IUD removed last yr due to expiration and no bleeding since. Has tolerable vasomotor sx.   Sex activity: single partner, contraception - post menopausal status. She does not have vaginal dryness.  Last Pap: 05/15/19 Results were: no abnormalities /neg HPV DNA 2019.   Last mammogram: 08/13/21   Results were: normal--routine follow-up in 12 months. Hx of benign bx for LT breast calcifications 2019. Has appt 11/23 There is no FH of breast cancer. There is no FH of ovarian cancer. The patient does do self-breast exams.  Colonoscopy: 2019 with Dr. Allen Norris, no bx done;  Repeat due after 10 years.   Tobacco use: The patient denies current or previous tobacco use. Alcohol use: none No drug use Exercise: moderately active  She does get adequate calcium but not Vitamin D in her diet.  Normal labs with [facility] 8/20 Takes lexapro for anxiety with sx relief. Needs Rx RF.  Has been having intermittent LT hip pain, shoots down leg. Sometimes hard to stand up. Not sure where to go for eval.    Past Medical History:  Diagnosis Date   Abnormal mammogram of left breast 10/2017   Birads 4 calcifications   Anxiety    Chronic kidney disease    KIDNEY STONE   Dysplastic nevus 01/10/2018   Right superior scapula. Moderate to severe atypia, close to margin.   Palpitations     Past Surgical History:  Procedure Laterality Date   AUGMENTATION MAMMAPLASTY Bilateral 2003   BREAST BIOPSY Left 11/14/2017   benign   COLONOSCOPY WITH PROPOFOL N/A 04/23/2018   Procedure: COLONOSCOPY WITH PROPOFOL;  Surgeon: Lucilla Lame, MD;  Location: [facility];  Service: Endoscopy;  Laterality: N/A;   CYSTOSCOPY W/ RETROGRADES  Bilateral 06/15/2015   Procedure: CYSTOSCOPY WITH RETROGRADE PYELOGRAM;  Surgeon: Festus Aloe, MD;  Location: [facility];  Service: Urology;  Laterality: Bilateral;   TONSILLECTOMY      Family History  Problem Relation Age of Onset   Asthma Mother    Diabetes Mother    Hypertension Maternal Grandmother    Osteoarthritis Paternal Grandmother    Breast cancer Other    Kidney cancer Neg Hx    Kidney disease Neg Hx    Prostate cancer Neg Hx     Social History   Socioeconomic History   Marital status: Married    Spouse name: Not on file   Number of children: 2   Years of education: Not on file   Highest education level: Not on file  Occupational History   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[employer]  Tobacco Use   Smoking status: Never   Smokeless tobacco: Never  Vaping Use   Vaping Use: Never used  Substance and Sexual Activity   Alcohol use: Yes    Alcohol/week: 0.0 standard drinks of alcohol    Comment: socially - may have 1 drink/wk   Drug use: No   Sexual activity: Yes    Birth control/protection: None  Other Topics Concern   Not on file  Social History Narrative   Not on file   Social Determinants of Health   Financial Resource Strain: Not on file  Food Insecurity: Not on file  Transportation Needs: Not  on file  Physical Activity: Insufficiently Active (12/05/2017)   Exercise Vital Sign    Days of Exercise per Week: 3 days    Minutes of Exercise per Session: 20 min  Stress: No Stress Concern Present (12/05/2017)   [place]    Feeling of Stress : Only a little  Social Connections: Moderately Integrated (12/05/2017)   Social Connection and Isolation Panel [NHANES]    Frequency of Communication with Friends and Family: More than three times a week    Frequency of Social Gatherings with Friends and Family: Once a week    Attends Religious Services: More than 4 times per year    Active Member of  [employer] or Organizations: No    Attends Archivist Meetings: Never    Marital Status: Married  Human resources officer Violence: Not At Risk (12/05/2017)   Humiliation, Afraid, Rape, and Kick questionnaire    Fear of Current or Ex-Partner: No    Emotionally Abused: No    Physically Abused: No    Sexually Abused: No     Current Outpatient Medications:    escitalopram (LEXAPRO) 20 MG tablet, TAKE 1 TABLET BY MOUTH EVERY DAY, Disp: 90 tablet, Rfl: 0     ROS:  Review of Systems  Constitutional:  Negative for fatigue, fever and unexpected weight change.  Respiratory:  Negative for cough, shortness of breath and wheezing.   Cardiovascular:  Negative for chest pain, palpitations and leg swelling.  Gastrointestinal:  Negative for blood in stool, constipation, diarrhea, nausea and vomiting.  Endocrine: Negative for cold intolerance, heat intolerance and polyuria.  Genitourinary:  Negative for dyspareunia, dysuria, flank pain, frequency, genital sores, hematuria, menstrual problem, pelvic pain, urgency, vaginal bleeding, vaginal discharge and vaginal pain.  Musculoskeletal:  Positive for arthralgias. Negative for back pain, joint swelling and myalgias.  Skin:  Negative for rash.  Neurological:  Positive for headaches. Negative for dizziness, syncope, light-headedness and numbness.  Hematological:  Negative for adenopathy.  Psychiatric/Behavioral:  Positive for agitation. Negative for confusion, sleep disturbance and suicidal ideas. The patient is not nervous/anxious.    BREAST: No symptoms    Objective: LMP  (LMP Unknown)    Physical Exam Constitutional:      Appearance: She is well-developed.  Genitourinary:     Vulva normal.     Right Labia: No rash, tenderness or lesions.    Left Labia: No tenderness, lesions or rash.    No vaginal discharge, erythema or tenderness.      Right Adnexa: not tender and no mass present.    Left Adnexa: not tender and no mass present.    No  cervical friability or polyp.     Uterus is not enlarged or tender.  Breasts:    Right: No mass, nipple discharge, skin change or tenderness.     Left: No mass, nipple discharge, skin change or tenderness.  Neck:     Thyroid: No thyromegaly.  Cardiovascular:     Rate and Rhythm: Normal rate and regular rhythm.     Heart sounds: Normal heart sounds. No murmur heard. Pulmonary:     Effort: Pulmonary effort is normal.     Breath sounds: Normal breath sounds.  Abdominal:     Palpations: Abdomen is soft.     Tenderness: There is no abdominal tenderness. There is no guarding or rebound.  Musculoskeletal:        General: Normal range of motion.     Cervical back: Normal  range of motion.  Lymphadenopathy:     Cervical: No cervical adenopathy.  Neurological:     General: No focal deficit present.     Mental Status: She is alert and oriented to person, place, and time.     Cranial Nerves: No cranial nerve deficit.  Skin:    General: Skin is warm and dry.  Psychiatric:        Mood and Affect: Mood normal.        Behavior: Behavior normal.        Thought Content: Thought content normal.        Judgment: Judgment normal.  Vitals reviewed.     Assessment/Plan:  Encounter for annual routine gynecological examination  Encounter for screening mammogram for malignant neoplasm of breast - Plan: MM 3D SCREEN BREAST BILATERAL; pt to sched mammo  Anxiety - Plan: escitalopram (LEXAPRO) 20 MG tablet; Rx RF, doing well  Vasomotor symptoms due to menopause; try estroven. F/u prn.   Hip pain--recommended seeing [facility] for further eval.   No orders of the defined types were placed in this encounter.           GYN counsel breast self exam, mammography screening, menopause, adequate intake of calcium and vitamin D, diet and exercise    F/U  No follow-ups on file.  Albertia Carvin B. Annaleigh Steinmeyer, PA-C 08/14/2022 9:25 PM

## 2022-08-16 ENCOUNTER — Encounter: Payer: Self-pay | Admitting: Obstetrics and Gynecology

## 2022-08-16 ENCOUNTER — Ambulatory Visit (INDEPENDENT_AMBULATORY_CARE_PROVIDER_SITE_OTHER): Payer: BC Managed Care – PPO | Admitting: Obstetrics and Gynecology

## 2022-08-16 ENCOUNTER — Other Ambulatory Visit (HOSPITAL_COMMUNITY)
Admission: RE | Admit: 2022-08-16 | Discharge: 2022-08-16 | Disposition: A | Discharge status: Home / Self Care | Payer: BC Managed Care – PPO | Source: Ambulatory Visit | Attending: Obstetrics and Gynecology | Admitting: Obstetrics and Gynecology

## 2022-08-16 VITALS — BP 100/70 | Ht 65.0 in | Wt 163.0 lb

## 2022-08-16 DIAGNOSIS — Z01411 Encounter for gynecological examination (general) (routine) with abnormal findings: Secondary | ICD-10-CM | POA: Diagnosis not present

## 2022-08-16 DIAGNOSIS — Z124 Encounter for screening for malignant neoplasm of cervix: Secondary | ICD-10-CM | POA: Insufficient documentation

## 2022-08-16 DIAGNOSIS — Z113 Encounter for screening for infections with a predominantly sexual mode of transmission: Secondary | ICD-10-CM | POA: Diagnosis present

## 2022-08-16 DIAGNOSIS — F419 Anxiety disorder, unspecified: Secondary | ICD-10-CM | POA: Diagnosis not present

## 2022-08-16 DIAGNOSIS — Z1151 Encounter for screening for human papillomavirus (HPV): Secondary | ICD-10-CM

## 2022-08-16 DIAGNOSIS — N951 Menopausal and female climacteric states: Secondary | ICD-10-CM

## 2022-08-16 DIAGNOSIS — Z01419 Encounter for gynecological examination (general) (routine) without abnormal findings: Secondary | ICD-10-CM

## 2022-08-16 DIAGNOSIS — Z1231 Encounter for screening mammogram for malignant neoplasm of breast: Secondary | ICD-10-CM

## 2022-08-16 MED ORDER — ESCITALOPRAM OXALATE 20 MG PO TABS: 30.0000 mg | ORAL_TABLET | Freq: Every day | ORAL | 3 refills | Status: DC

## 2022-08-16 NOTE — Patient Instructions (Signed)
I value your feedback and you entrusting us with your care. If you get a [facility] patient survey, I would appreciate you taking the time to let us know about your experience today. Thank you! ? ? ?

## 2022-08-17 ENCOUNTER — Ambulatory Visit
Admission: RE | Admit: 2022-08-17 | Discharge: 2022-08-17 | Disposition: A | Discharge status: Home / Self Care | Payer: BC Managed Care – PPO | Source: Ambulatory Visit | Attending: Obstetrics and Gynecology | Admitting: Obstetrics and Gynecology

## 2022-08-17 DIAGNOSIS — Z1231 Encounter for screening mammogram for malignant neoplasm of breast: Secondary | ICD-10-CM | POA: Diagnosis present

## 2022-08-19 LAB — CYTOLOGY - PAP
Comment: NEGATIVE
Diagnosis: UNDETERMINED — AB
High risk HPV: NEGATIVE

## 2022-08-21 ENCOUNTER — Encounter: Payer: Self-pay | Admitting: Obstetrics and Gynecology

## 2022-08-22 ENCOUNTER — Encounter: Payer: Self-pay | Admitting: Obstetrics and Gynecology

## 2022-09-14 ENCOUNTER — Ambulatory Visit: Payer: BC Managed Care – PPO | Admitting: Dermatology

## 2022-09-14 ENCOUNTER — Encounter: Payer: Self-pay | Admitting: Dermatology

## 2022-09-14 VITALS — BP 130/68 | HR 73

## 2022-09-14 DIAGNOSIS — Q825 Congenital non-neoplastic nevus: Secondary | ICD-10-CM

## 2022-09-14 DIAGNOSIS — Z1283 Encounter for screening for malignant neoplasm of skin: Secondary | ICD-10-CM

## 2022-09-14 DIAGNOSIS — L578 Other skin changes due to chronic exposure to nonionizing radiation: Secondary | ICD-10-CM

## 2022-09-14 DIAGNOSIS — D229 Melanocytic nevi, unspecified: Secondary | ICD-10-CM

## 2022-09-14 DIAGNOSIS — Z86018 Personal history of other benign neoplasm: Secondary | ICD-10-CM | POA: Diagnosis not present

## 2022-09-14 DIAGNOSIS — L814 Other melanin hyperpigmentation: Secondary | ICD-10-CM

## 2022-09-14 DIAGNOSIS — L821 Other seborrheic keratosis: Secondary | ICD-10-CM

## 2022-09-14 NOTE — Progress Notes (Signed)
   Follow-Up Visit   Subjective  Claire Ramos is a 56 y.o. female who presents for the following: Annual Exam (Hx of dysplastic nevus).  The patient presents for Total-Body Skin Exam (TBSE) for skin cancer screening and mole check.  The patient has spots, moles and lesions to be evaluated, some may be new or changing and the patient has concerns that these could be cancer.  The following portions of the chart were reviewed this encounter and updated as appropriate:  Tobacco  Allergies  Meds  Problems  Med Hx  Surg Hx  Fam Hx      Review of Systems: No other skin or systemic complaints except as noted in HPI or Assessment and Plan.   Objective  Well appearing patient in no apparent distress; mood and affect are within normal limits.  A full examination was performed including scalp, head, eyes, ears, nose, lips, neck, chest, axillae, abdomen, back, buttocks, bilateral upper extremities, bilateral lower extremities, hands, feet, fingers, toes, fingernails, and toenails. All findings within normal limits unless otherwise noted below.  Left Popliteal Fossa Thin tan waxy lesion  left pretibia Brown plaque    Assessment & Plan  Seborrheic keratosis Left Popliteal Fossa  Reassured benign age-related growth.  Recommend observation.  Discussed cryotherapy if spot(s) become irritated or inflamed.  Congenital non-neoplastic nevus left pretibia  Benign-appearing.  Observation.  Call clinic for new or changing lesions.  Recommend daily use of broad spectrum spf 30+ sunscreen to sun-exposed areas.     History of Dysplastic Nevus. Right superior scapula. Mod-severe atypia. 01/10/2018. - No evidence of recurrence today - Recommend regular full body skin exams - Recommend daily broad spectrum sunscreen SPF 30+ to sun-exposed areas, reapply every 2 hours as needed.  - Call if any new or changing lesions are noted between office visits   Lentigines - Scattered tan macules - Due to  sun exposure - Benign-appearing, observe - Recommend daily broad spectrum sunscreen SPF 30+ to sun-exposed areas, reapply every 2 hours as needed. - Call for any changes  Seborrheic Keratoses - Stuck-on, waxy, tan-brown papules and/or plaques  - Benign-appearing - Discussed benign etiology and prognosis. - Observe - Call for any changes  Melanocytic Nevi - Tan-brown and/or pink-flesh-colored symmetric macules and papules - Benign appearing on exam today - Observation - Call clinic for new or changing moles - Recommend daily use of broad spectrum spf 30+ sunscreen to sun-exposed areas.   Hemangiomas - Red papules - Discussed benign nature - Observe - Call for any changes  Actinic Damage - Chronic condition, secondary to cumulative UV/sun exposure - diffuse scaly erythematous macules with underlying dyspigmentation - Recommend daily broad spectrum sunscreen SPF 30+ to sun-exposed areas, reapply every 2 hours as needed.  - Staying in the shade or wearing long sleeves, sun glasses (UVA+UVB protection) and wide brim hats (4-inch brim around the entire circumference of the hat) are also recommended for sun protection.  - Call for new or changing lesions.  Skin cancer screening performed today.   Return in about 1 year (around 09/15/2023) for TBSE, HxDN.  I, Emelia Salisbury, CMA, am acting as scribe for Forest Gleason, MD.  Documentation: I have reviewed the above documentation for accuracy and completeness, and I agree with the above.  Forest Gleason, MD

## 2022-09-14 NOTE — Patient Instructions (Addendum)
Recommend taking Heliocare sun protection supplement daily in sunny weather for additional sun protection. For maximum protection on the sunniest days, you can take up to 2 capsules of regular Heliocare OR take 1 capsule of Heliocare Ultra. For prolonged exposure (such as a full day in the sun), you can repeat your dose of the supplement 4 hours after your first dose. Heliocare can be purchased at [facility], at some [facility] or at www.heliocare.com.    Recommend daily broad spectrum sunscreen SPF 30+ to sun-exposed areas, reapply every 2 hours as needed. Call for new or changing lesions.  Staying in the shade or wearing long sleeves, sun glasses (UVA+UVB protection) and wide brim hats (4-inch brim around the entire circumference of the hat) are also recommended for sun protection.    Melanoma ABCDEs  Melanoma is the most dangerous type of skin cancer, and is the leading cause of death from skin disease.  You are more likely to develop melanoma if you: Have light-colored skin, light-colored eyes, or red or blond hair Spend a lot of time in the sun Tan regularly, either outdoors or in a tanning bed Have had blistering sunburns, especially during childhood Have a close family member who has had a melanoma Have atypical moles or large birthmarks  Early detection of melanoma is key since treatment is typically straightforward and cure rates are extremely high if we catch it early.   The first sign of melanoma is often a change in a mole or a new dark spot.  The ABCDE system is a way of remembering the signs of melanoma.  A for asymmetry:  The two halves do not match. B for border:  The edges of the growth are irregular. C for color:  A mixture of colors are present instead of an even brown color. D for diameter:  Melanomas are usually (but not always) greater than 6mm - the size of a pencil eraser. E for evolution:  The spot keeps changing in size, shape, and color.  Please check  your skin once per month between visits. You can use a small mirror in front and a large mirror behind you to keep an eye on the back side or your body.   If you see any new or changing lesions before your next follow-up, please call to schedule a visit.  Please continue daily skin protection including broad spectrum sunscreen SPF 30+ to sun-exposed areas, reapplying every 2 hours as needed when you're outdoors.   Staying in the shade or wearing long sleeves, sun glasses (UVA+UVB protection) and wide brim hats (4-inch brim around the entire circumference of the hat) are also recommended for sun protection.    Due to recent changes in healthcare laws, you may see results of your pathology and/or laboratory studies on MyChart before the doctors have had a chance to review them. We understand that in some cases there may be results that are confusing or concerning to you. Please understand that not all results are received at the same time and often the doctors may need to interpret multiple results in order to provide you with the best plan of care or course of treatment. Therefore, we ask that you please give us 2 business days to thoroughly review all your results before contacting the office for clarification. Should we see a critical lab result, you will be contacted sooner.   If You Need Anything After Your Visit  If you have any questions or concerns for your doctor, please   call our main line at [phone] and press option 4 to reach your doctor's medical assistant. If no one answers, please leave a voicemail as directed and we will return your call as soon as possible. Messages left after 4 pm will be answered the following business day.   You may also send us a message via MyChart. We typically respond to MyChart messages within 1-2 business days.  For prescription refills, please ask your pharmacy to contact our office. Our fax number is [phone].  If you have an urgent issue when the  clinic is closed that cannot wait until the next business day, you can page your doctor at the number below.    Please note that while we do our best to be available for urgent issues outside of office hours, we are not available 24/7.   If you have an urgent issue and are unable to reach us, you may choose to seek medical care at your doctor's office, retail clinic, urgent care center, or emergency room.  If you have a medical emergency, please immediately call 911 or go to the emergency department.  Pager Numbers  - Dr. Kowalski: [phone]  - Dr. Moye: [phone]  - Dr. Stewart: [phone]  In the event of inclement weather, please call our main line at [phone] for an update on the status of any delays or closures.  Dermatology Medication Tips: Please keep the boxes that topical medications come in in order to help keep track of the instructions about where and how to use these. Pharmacies typically print the medication instructions only on the boxes and not directly on the medication tubes.   If your medication is too expensive, please contact our office at [phone] option 4 or send us a message through MyChart.   We are unable to tell what your co-pay for medications will be in advance as this is different depending on your insurance coverage. However, we may be able to find a substitute medication at lower cost or fill out paperwork to get insurance to cover a needed medication.   If a prior authorization is required to get your medication covered by your insurance company, please allow us 1-2 business days to complete this process.  Drug prices often vary depending on where the prescription is filled and some pharmacies may offer cheaper prices.  The website www.goodrx.com contains coupons for medications through different pharmacies. The prices here do not account for what the cost may be with help from insurance (it may be cheaper with your insurance), but the  website can give you the price if you did not use any insurance.  - You can print the associated coupon and take it with your prescription to the pharmacy.  - You may also stop by our office during regular business hours and pick up a GoodRx coupon card.  - If you need your prescription sent electronically to a different pharmacy, notify our office through [facility] MyChart or by phone at [phone] option 4.     Si Usted Necesita Algo Despus de Su Visita  Tambin puede enviarnos un mensaje a travs de MyChart. Por lo general respondemos a los mensajes de MyChart en el transcurso de 1 a 2 das hbiles.  Para renovar recetas, por favor pida a su farmacia que se ponga en contacto con nuestra oficina. Nuestro nmero de fax es el [phone].  Si tiene un asunto urgente cuando la clnica est cerrada y que no puede esperar hasta el siguiente da hbil,   puede llamar/localizar a su doctor(a) al nmero que aparece a continuacin.   Por favor, tenga en cuenta que aunque hacemos todo lo posible para estar disponibles para asuntos urgentes fuera del horario de oficina, no estamos disponibles las 24 horas del da, los 7 das de la semana.   Si tiene un problema urgente y no puede comunicarse con nosotros, puede optar por buscar atencin mdica  en el consultorio de su doctor(a), en una clnica privada, en un centro de atencin urgente o en una sala de emergencias.  Si tiene una emergencia mdica, por favor llame inmediatamente al 911 o vaya a la sala de emergencias.  Nmeros de bper  - Dr. Kowalski: [phone]  - Dra. Moye: [phone]  - Dra. Stewart: [phone]  En caso de inclemencias del tiempo, por favor llame a nuestra lnea principal al [phone] para una actualizacin sobre el estado de cualquier retraso o cierre.  Consejos para la medicacin en dermatologa: Por favor, guarde las cajas en las que vienen los medicamentos de uso tpico para ayudarle a seguir las  instrucciones sobre dnde y cmo usarlos. Las farmacias generalmente imprimen las instrucciones del medicamento slo en las cajas y no directamente en los tubos del medicamento.   Si su medicamento es muy caro, por favor, pngase en contacto con nuestra oficina llamando al [phone] y presione la opcin 4 o envenos un mensaje a travs de MyChart.   No podemos decirle cul ser su copago por los medicamentos por adelantado ya que esto es diferente dependiendo de la cobertura de su seguro. Sin embargo, es posible que podamos encontrar un medicamento sustituto a menor costo o llenar un formulario para que el seguro cubra el medicamento que se considera necesario.   Si se requiere una autorizacin previa para que su compaa de seguros cubra su medicamento, por favor permtanos de 1 a 2 das hbiles para completar este proceso.  Los precios de los medicamentos varan con frecuencia dependiendo del lugar de dnde se surte la receta y alguna farmacias pueden ofrecer precios ms baratos.  El sitio web www.goodrx.com tiene cupones para medicamentos de diferentes farmacias. Los precios aqu no tienen en cuenta lo que podra costar con la ayuda del seguro (puede ser ms barato con su seguro), pero el sitio web puede darle el precio si no utiliz ningn seguro.  - Puede imprimir el cupn correspondiente y llevarlo con su receta a la farmacia.  - Tambin puede pasar por nuestra oficina durante el horario de atencin regular y recoger una tarjeta de cupones de GoodRx.  - Si necesita que su receta se enve electrnicamente a una farmacia diferente, informe a nuestra oficina a travs de MyChart de [facility] o por telfono llamando al [phone] y presione la opcin 4.  

## 2022-09-21 ENCOUNTER — Encounter: Payer: Self-pay | Admitting: Dermatology

## 2022-11-11 ENCOUNTER — Other Ambulatory Visit: Payer: Self-pay | Admitting: Obstetrics and Gynecology

## 2022-11-11 DIAGNOSIS — F419 Anxiety disorder, unspecified: Secondary | ICD-10-CM

## 2023-02-23 ENCOUNTER — Emergency Department
Admission: EM | Admit: 2023-02-23 | Discharge: 2023-02-23 | Disposition: A | Discharge status: Home / Self Care | Payer: BC Managed Care – PPO | Attending: Emergency Medicine | Admitting: Emergency Medicine

## 2023-02-23 ENCOUNTER — Emergency Department: Payer: BC Managed Care – PPO

## 2023-02-23 ENCOUNTER — Encounter: Payer: Self-pay | Admitting: Emergency Medicine

## 2023-02-23 DIAGNOSIS — X501XXA Overexertion from prolonged static or awkward postures, initial encounter: Secondary | ICD-10-CM | POA: Diagnosis not present

## 2023-02-23 DIAGNOSIS — S39012A Strain of muscle, fascia and tendon of lower back, initial encounter: Secondary | ICD-10-CM | POA: Diagnosis not present

## 2023-02-23 DIAGNOSIS — Y92219 Unspecified school as the place of occurrence of the external cause: Secondary | ICD-10-CM | POA: Insufficient documentation

## 2023-02-23 DIAGNOSIS — S3992XA Unspecified injury of lower back, initial encounter: Secondary | ICD-10-CM | POA: Diagnosis present

## 2023-02-23 DIAGNOSIS — Y990 Civilian activity done for income or pay: Secondary | ICD-10-CM | POA: Insufficient documentation

## 2023-02-23 DIAGNOSIS — N189 Chronic kidney disease, unspecified: Secondary | ICD-10-CM | POA: Insufficient documentation

## 2023-02-23 DIAGNOSIS — M545 Low back pain, unspecified: Secondary | ICD-10-CM

## 2023-02-23 MED ORDER — LIDOCAINE 5 % EX PTCH: 1.0000 | MEDICATED_PATCH | CUTANEOUS | 0 refills | Status: DC

## 2023-02-23 MED ORDER — KETOROLAC TROMETHAMINE 60 MG/2ML IM SOLN
30.0000 mg | Freq: Once | INTRAMUSCULAR | Status: AC
  Administered 2023-02-23: 30 mg via INTRAMUSCULAR
  Filled 2023-02-23: qty 2

## 2023-02-23 MED ORDER — LIDOCAINE 5 % EX PTCH
1.0000 | MEDICATED_PATCH | CUTANEOUS | Status: DC
  Administered 2023-02-23: 1 via TRANSDERMAL
  Filled 2023-02-23: qty 1

## 2023-02-23 MED ORDER — CYCLOBENZAPRINE HCL 5 MG PO TABS: ORAL_TABLET | ORAL | 0 refills | Status: DC

## 2023-02-23 MED ORDER — CYCLOBENZAPRINE HCL 10 MG PO TABS
5.0000 mg | ORAL_TABLET | Freq: Once | ORAL | Status: AC
  Administered 2023-02-23: 5 mg via ORAL
  Filled 2023-02-23: qty 1

## 2023-02-23 MED ORDER — IBUPROFEN 800 MG PO TABS: 800.0000 mg | ORAL_TABLET | Freq: Three times a day (TID) | ORAL | 0 refills | Status: DC | PRN

## 2023-02-23 MED ORDER — HYDROCODONE-ACETAMINOPHEN 5-325 MG PO TABS: 1.0000 | ORAL_TABLET | Freq: Four times a day (QID) | ORAL | 0 refills | Status: DC | PRN

## 2023-02-23 MED ORDER — HYDROCODONE-ACETAMINOPHEN 5-325 MG PO TABS
1.0000 | ORAL_TABLET | Freq: Once | ORAL | Status: AC
  Administered 2023-02-23: 1 via ORAL
  Filled 2023-02-23: qty 1

## 2023-02-23 NOTE — ED Triage Notes (Signed)
Pt presents ambulatory to triage via POV with complaints of L sided lower back pain that started yesterday. 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 She notes taking OTC pain meds with no relief. A&Ox4 at this time. Denies urinary sx, falls, injury, incontinence, CP or SOB.

## 2023-02-23 NOTE — Discharge Instructions (Signed)
You may take medicines as needed for pain and muscle spasms (Motrin/Norco/Flexeril #15).  Apply Lidocaine patch as directed.  Return to the ER for worsening symptoms, persistent vomiting, extremity weakness, bowel or bladder incontinence, or other concerns.

## 2023-02-23 NOTE — ED Provider Notes (Signed)
[facility] Provider Note    Event Date/Time   First MD Initiated Contact with Patient 02/23/23 [phone]     (approximate)   History   Back Pain   HPI  Claire Ramos is a 57 y.o. female who presents to the ED from home with a chief complaint of nontraumatic lower back pain which began yesterday.  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  Took Doan's over-the-counter pain reliever without relief of symptoms.  Denies associated fever/chills, chest pain, shortness of breath, abdominal pain, nausea, vomiting, dysuria, bowel/bladder incontinence, extremity weakness/numbness/tingling.     Past Medical History   Past Medical History:  Diagnosis Date   Abnormal mammogram of left breast 10/2017   Birads 4 calcifications   Anxiety    Chronic kidney disease    KIDNEY STONE   Dysplastic nevus 01/10/2018   Right superior scapula. Moderate to severe atypia, close to margin.   Palpitations      Active Problem List   Patient Active Problem List   Diagnosis Date Noted   Vasomotor symptoms due to menopause 05/25/2021   Anxiety 05/22/2020   Insomnia 05/22/2020   Encounter for screening colonoscopy    Breast calcifications 05/16/2017   Gross hematuria 05/17/2015   Acute low back pain 05/17/2015     Past Surgical History   Past Surgical History:  Procedure Laterality Date   AUGMENTATION MAMMAPLASTY Bilateral 2003   BREAST BIOPSY Left 11/14/2017   benign   COLONOSCOPY WITH PROPOFOL N/A 04/23/2018   Procedure: COLONOSCOPY WITH PROPOFOL;  Surgeon: Midge Minium, MD;  Location: [facility];  Service: Endoscopy;  Laterality: N/A;   CYSTOSCOPY W/ RETROGRADES Bilateral 06/15/2015   Procedure: CYSTOSCOPY WITH RETROGRADE PYELOGRAM;  Surgeon: Jerilee Field, MD;  Location: [facility];  Service: Urology;  Laterality: Bilateral;   TONSILLECTOMY       Home Medications   Prior to Admission medications    Medication Sig Start Date End Date Taking? Authorizing Provider  escitalopram (LEXAPRO) 20 MG tablet Take 1.5 tablets (30 mg total) by mouth daily. 08/16/22   Copland, Ilona Sorrel, PA-C     Allergies  Patient has no known allergies.   Family History   Family History  Problem Relation Age of Onset   Kidney cancer Mother 48   Asthma Mother    Diabetes Mother    Hypertension Maternal Grandmother    Osteoarthritis Paternal Grandmother    Breast cancer Other        50s   Kidney disease Neg Hx    Prostate cancer Neg Hx      Physical Exam  Triage Vital Signs: ED Triage Vitals  Enc Vitals Group     BP 02/23/23 0234 (!) 142/65     Pulse Rate 02/23/23 0234 71     Resp 02/23/23 0234 18     Temp 02/23/23 0234 98.3 F (36.8 C)     Temp Source 02/23/23 0234 Oral     SpO2 02/23/23 0234 98 %     Weight 02/23/23 0233 163 lb (73.9 kg)     Height 02/23/23 0233 5\' 5"  (1.651 m)     Head Circumference --      Peak Flow --      Pain Score 02/23/23 0233 8     Pain Loc --      Pain Edu? --      Excl. in GC? --     Updated Vital Signs:  BP (!) 142/65 (BP Location: Right Arm)   Pulse 71   Temp 98.3 F (36.8 C) (Oral)   Resp 18   Ht 5\' 5"  (1.651 m)   Wt 73.9 kg   LMP  (LMP Unknown)   SpO2 98%   BMI 27.12 kg/m    General: Awake, mild distress.  CV:  RRR.  Good peripheral perfusion.  Resp:  Normal effort.  CTAB. Abd:  Nontender.  No distention.  Other:  Midline low lumbar spinal tenderness to palpation and left sided paraspinal muscle spasms.  Negative straight leg raise.  5/5 motor strength and sensation bilaterally.  2+ femoral pulses.   ED Results / Procedures / Treatments  Labs (all labs ordered are listed, but only abnormal results are displayed) Labs Reviewed - No data to display   EKG  None   RADIOLOGY I have independently visualized and interpreted patient's x-ray as well as noted the radiology interpretation:  Lumbar spine: Degenerative disc and facet disease  most pronounced in L4-S1  Official radiology report(s): DG Lumbar Spine Complete  Result Date: 02/23/2023 CLINICAL DATA:  Low back pain EXAM: LUMBAR SPINE - COMPLETE 4+ VIEW COMPARISON:  None Available. FINDINGS: Degenerative facet disease diffusely throughout the lumbar spine, most pronounced in the lower lumbar spine. Disc space narrowing at L4-5 and L5-S1. No fracture or subluxation. SI joints and hip joints symmetric. IMPRESSION: Degenerative disc and facet disease most pronounced in the lower lumbar spine. No acute bony abnormality. Electronically Signed   By: Charlett Nose M.D.   On: 02/23/2023 03:28     PROCEDURES:  Critical Care performed: No  Procedures   MEDICATIONS ORDERED IN ED: Medications  lidocaine (LIDODERM) 5 % 1 patch (1 patch Transdermal Patch Applied 02/23/23 0252)  ketorolac (TORADOL) injection 30 mg (30 mg Intramuscular Given 02/23/23 0259)  HYDROcodone-acetaminophen (NORCO/VICODIN) 5-325 MG per tablet 1 tablet (1 tablet Oral Given 02/23/23 0254)  cyclobenzaprine (FLEXERIL) tablet 5 mg (5 mg Oral Given 02/23/23 0253)     IMPRESSION / MDM / ASSESSMENT AND PLAN / ED COURSE  I reviewed the triage vital signs and the nursing notes.                             57 year old female presenting with low back pain/strain.  No focal neurological deficits noted on clinical examination.  Patient requesting x-ray.  Will treat with IM ketorolac, lidocaine patch, Norco and Flexeril.  Will reassess.  Patient's presentation is most consistent with acute complicated illness / injury requiring diagnostic workup.  0352 Feeling better after medications.  Updated her on x-ray results.  She states she has been having neck pain as well.  Will refer to neurosurgery for outpatient follow-up.  Will discharge home with NSAIDs, lidocaine patch, analgesia and muscle relaxer.  Strict return precautions given.  Patient verbalizes understanding and agrees with plan of care.  FINAL CLINICAL  IMPRESSION(S) / ED DIAGNOSES   Final diagnoses:  Acute midline low back pain without sciatica  Strain of lumbar region, initial encounter     Rx / DC Orders   ED Discharge Orders     None        Note:  This document was prepared using Dragon voice recognition software and may include unintentional dictation errors.   Irean Hong, MD 02/23/23 [phone]

## 2023-02-24 NOTE — Progress Notes (Unsigned)
Referring Physician:  Rica Records, PA-C [address] [place],  [place] [address]  Primary Physician:  Rica Records, PA-C  History of Present Illness: 02/27/2023 Ms. Tienna Ramos is healthy.   Seen in ED on 02/23/23 with LBP and no leg pain. She history of intermittent LBP, but it was never this severe.   She is much better than she was, but she still has constant LBP with no leg pain. Pain is more of a stiffness with intermittent sharp pains. Pain is worse with prolonged sitting. She has some stiffness with walking. No numbness, tingling, weakness in her legs.   She was given flexeril, norco 5, motrin, and lidocaine patches from ED. She is still taking prn flexeril and motrin.   Bowel/Bladder Dysfunction: she has urinary leakage that is chronic.   Conservative measures:  Physical therapy: has not participated  Multimodal medical therapy including regular antiinflammatories: Doans back pain, flexeril, norco, ibuprofen, lidocaine patches  Injections: has not received epidural steroid injections  Past Surgery: no previous spine surgery  Adylin G Ramos has no symptoms of cervical myelopathy.  The symptoms are causing a significant impact on the patient's life.   Review of Systems:  A 10 point review of systems is negative, except for the pertinent positives and negatives detailed in the HPI.  Past Medical History: Past Medical History:  Diagnosis Date   Abnormal mammogram of left breast 10/2017   Birads 4 calcifications   Anxiety    Chronic kidney disease    KIDNEY STONE   Dysplastic nevus 01/10/2018   Right superior scapula. Moderate to severe atypia, close to margin.   Palpitations     Past Surgical History: Past Surgical History:  Procedure Laterality Date   AUGMENTATION MAMMAPLASTY Bilateral 2003   BREAST BIOPSY Left 11/14/2017   benign   COLONOSCOPY WITH PROPOFOL N/A 04/23/2018   Procedure: COLONOSCOPY WITH PROPOFOL;  Surgeon: Midge Minium, MD;   Location: [facility];  Service: Endoscopy;  Laterality: N/A;   CYSTOSCOPY W/ RETROGRADES Bilateral 06/15/2015   Procedure: CYSTOSCOPY WITH RETROGRADE PYELOGRAM;  Surgeon: Jerilee Field, MD;  Location: [facility];  Service: Urology;  Laterality: Bilateral;   TONSILLECTOMY      Allergies: Allergies as of 02/27/2023   (No Known Allergies)    Medications: Outpatient Encounter Medications as of 02/27/2023  Medication Sig   lidocaine 4 % Place 1 patch onto the skin daily.   cyclobenzaprine (FLEXERIL) 5 MG tablet 1 tablet 3 times daily as needed for muscle spasms   escitalopram (LEXAPRO) 20 MG tablet Take 1.5 tablets (30 mg total) by mouth daily.   HYDROcodone-acetaminophen (NORCO) 5-325 MG tablet Take 1 tablet by mouth every 6 (six) hours as needed for moderate pain.   ibuprofen (ADVIL) 800 MG tablet Take 1 tablet (800 mg total) by mouth every 8 (eight) hours as needed for moderate pain.   [DISCONTINUED] lidocaine (LIDODERM) 5 % Place 1 patch onto the skin daily. Remove & Discard patch within 12 hours or as directed by MD   No facility-administered encounter medications on file as of 02/27/2023.    Social History: Social History   Tobacco Use   Smoking status: Never   Smokeless tobacco: Never  Vaping Use   Vaping Use: Never used  Substance Use Topics   Alcohol use: Yes    Alcohol/week: 0.0 standard drinks of alcohol    Comment: socially - may have 1 drink/wk   Drug use: No    Family Medical History: Family History  Problem Relation Age of Onset   Kidney cancer Mother 7   Asthma Mother    Diabetes Mother    Hypertension Maternal Grandmother    Osteoarthritis Paternal Grandmother    Breast cancer Other        2s   Kidney disease Neg Hx    Prostate cancer Neg Hx     Physical Examination: Vitals:   02/27/23 1407  BP: 102/72    General: Patient is well developed, well nourished, calm, collected, and in no apparent distress. Attention to examination is  appropriate.  Respiratory: Patient is breathing without any difficulty.   NEUROLOGICAL:     Awake, alert, oriented to person, place, and time.  Speech is clear and fluent. Fund of knowledge is appropriate.   Cranial Nerves: Pupils equal round and reactive to light.  Facial tone is symmetric.    No posterior lumbar tenderness.   No abnormal lesions on exposed skin.   Strength: Side Biceps Triceps Deltoid Interossei Grip Wrist Ext. Wrist Flex.  R 5 5 5 5 5 5 5   L 5 5 5 5 5 5 5    Side Iliopsoas Quads Hamstring PF DF EHL  R 5 5 5 5 5 5   L 5 5 5 5 5 5    Reflexes are 2+ and symmetric at the biceps, triceps, brachioradialis, patella and achilles.   Hoffman's is absent.  Clonus is not present.   Bilateral upper and lower extremity sensation is intact to light touch.     Gait is normal.     Medical Decision Making  Imaging: Lumbar xrays dated 02/23/23:  FINDINGS: Degenerative facet disease diffusely throughout the lumbar spine, most pronounced in the lower lumbar spine. Disc space narrowing at L4-5 and L5-S1. No fracture or subluxation. SI joints and hip joints symmetric.   IMPRESSION: Degenerative disc and facet disease most pronounced in the lower lumbar spine. No acute bony abnormality.     Electronically Signed   By: Charlett Nose M.D.   On: 02/23/2023 03:28  I have personally reviewed the images and agree with the above interpretation.  Assessment and Plan: [name] is a pleasant 57 y.o. female has history of intermittent LBP with flare up of severe pain on 02/23/23.  She is much better than she was, but she still has constant LBP with no leg pain. Pain is more of a stiffness with intermittent sharp pains. No numbness, tingling, weakness in her legs.   She has known lumbar spondylosis and DDD L4-S1. LBP is likely multifactorial.  Treatment options discussed with patient and following plan made:   - Recommend PT for lumbar spine. She goes to her beach house in  [facility] for the summer. She was given lumbar HEP for now and will contact me if she finds PT facility in [facility].  - Okay to continue on prn OTC motrin and take as directed on bottle. Take with food.  - Refill given on flexeril to take prn spasms. Reviewed dosing and side effects. Discussed this can cause drowsiness.  - Follow up with me in August when she gets back from the beach.  - If no improvement, may consider lumbar MRI and possible referral for injections.   I spent a total of 35 minutes in face-to-face and non-face-to-face activities related to this patient's care today including review of outside records, review of imaging, review of symptoms, physical exam, discussion of differential diagnosis, discussion of treatment options, and documentation.   Thank you for involving me in the  care of this patient.   Drake Leach PA-C Dept. of Neurosurgery

## 2023-02-27 ENCOUNTER — Ambulatory Visit: Payer: BC Managed Care – PPO | Admitting: Orthopedic Surgery

## 2023-02-27 ENCOUNTER — Encounter: Payer: Self-pay | Admitting: Orthopedic Surgery

## 2023-02-27 VITALS — BP 102/72 | Ht 65.0 in | Wt 166.6 lb

## 2023-02-27 DIAGNOSIS — M5136 Other intervertebral disc degeneration, lumbar region: Secondary | ICD-10-CM

## 2023-02-27 DIAGNOSIS — M47816 Spondylosis without myelopathy or radiculopathy, lumbar region: Secondary | ICD-10-CM

## 2023-02-27 MED ORDER — CYCLOBENZAPRINE HCL 5 MG PO TABS
ORAL_TABLET | ORAL | 0 refills | Status: DC
Start: 2023-02-27 — End: 2023-08-28

## 2023-02-27 NOTE — Patient Instructions (Signed)
It was so nice to see you today. Thank you so much for coming in.    Your xrays showed some degeneration of the discs along with some wear and tear (arthritis) in your back. This is likely what is causing your pain.   I gave you some exercises to do. Stop any that make your pain worse.   Once you get to the beach, find a local physical therapy place and see if they take you insurance. If you call and let me know the location, I can send them PT orders for your back.   Okay to take motrin as needed to help with pain and inflammation. Take as directed with food. You can go back to the over the counter dose and take as directed on bottle.   I sent a refill for cyclobenzaprine to help with muscle spasms. Use only as needed and be careful, this can make you sleepy.   I will see you back in August. Please do not hesitate to call if you have any questions or concerns. You can also message me in MyChart.   Drake Leach PA-C [phone]

## 2023-04-26 NOTE — Progress Notes (Deleted)
Referring Physician:  Rica Records, PA-C [address] [place],  [place] [address]  Primary Physician:  Rica Records, PA-C  History of Present Illness: 04/26/2023 [name] is healthy.   Last seen by me on 02/27/23 for LBP with no leg pain. She has known lumbar spondylosis and DDD L4-S1. LBP is likely multifactorial.   She was given lumbar HEP and PT was recommended. She ws to let me know if she found PT in [facility] (was there for the summer at her beach house)- I have not heard from her. She was to take prn motrin and flexeril.   She is here for follow up.        Seen in ED on 02/23/23 with LBP and no leg pain. She history of intermittent LBP, but it was never this severe.   She is much better than she was, but she still has constant LBP with no leg pain. Pain is more of a stiffness with intermittent sharp pains. Pain is worse with prolonged sitting. She has some stiffness with walking. No numbness, tingling, weakness in her legs.   She was given flexeril, norco 5, motrin, and lidocaine patches from ED. She is still taking prn flexeril and motrin.        Bowel/Bladder Dysfunction: she has urinary leakage that is chronic.   Conservative measures:  Physical therapy: has not participated  Multimodal medical therapy including regular antiinflammatories: Doans back pain, flexeril, norco, ibuprofen, lidocaine patches  Injections: has not received epidural steroid injections  Past Surgery: no previous spine surgery  Claire Ramos has no symptoms of cervical myelopathy.  The symptoms are causing a significant impact on the patient's life.   Review of Systems:  A 10 point review of systems is negative, except for the pertinent positives and negatives detailed in the HPI.  Past Medical History: Past Medical History:  Diagnosis Date   Abnormal mammogram of left breast 10/2017   Birads 4 calcifications   Anxiety    Chronic kidney disease    KIDNEY STONE    Dysplastic nevus 01/10/2018   Right superior scapula. Moderate to severe atypia, close to margin.   Palpitations     Past Surgical History: Past Surgical History:  Procedure Laterality Date   AUGMENTATION MAMMAPLASTY Bilateral 2003   BREAST BIOPSY Left 11/14/2017   benign   COLONOSCOPY WITH PROPOFOL N/A 04/23/2018   Procedure: COLONOSCOPY WITH PROPOFOL;  Surgeon: Midge Minium, MD;  Location: [facility];  Service: Endoscopy;  Laterality: N/A;   CYSTOSCOPY W/ RETROGRADES Bilateral 06/15/2015   Procedure: CYSTOSCOPY WITH RETROGRADE PYELOGRAM;  Surgeon: Jerilee Field, MD;  Location: [facility];  Service: Urology;  Laterality: Bilateral;   TONSILLECTOMY      Allergies: Allergies as of 05/08/2023   (No Known Allergies)    Medications: Outpatient Encounter Medications as of 05/08/2023  Medication Sig   cyclobenzaprine (FLEXERIL) 5 MG tablet 1 tablet 3 times daily as needed for muscle spasms. This can make you sleepy.   escitalopram (LEXAPRO) 20 MG tablet Take 1.5 tablets (30 mg total) by mouth daily.   HYDROcodone-acetaminophen (NORCO) 5-325 MG tablet Take 1 tablet by mouth every 6 (six) hours as needed for moderate pain.   ibuprofen (ADVIL) 800 MG tablet Take 1 tablet (800 mg total) by mouth every 8 (eight) hours as needed for moderate pain.   lidocaine 4 % Place 1 patch onto the skin daily.   No facility-administered encounter medications on file as of 05/08/2023.  Social History: Social History   Tobacco Use   Smoking status: Never   Smokeless tobacco: Never  Vaping Use   Vaping status: Never Used  Substance Use Topics   Alcohol use: Yes    Alcohol/week: 0.0 standard drinks of alcohol    Comment: socially - may have 1 drink/wk   Drug use: No    Family Medical History: Family History  Problem Relation Age of Onset   Kidney cancer Mother 41   Asthma Mother    Diabetes Mother    Hypertension Maternal Grandmother    Osteoarthritis Paternal Grandmother     Breast cancer Other        22s   Kidney disease Neg Hx    Prostate cancer Neg Hx     Physical Examination: There were no vitals filed for this visit.    Awake, alert, oriented to person, place, and time.  Speech is clear and fluent. Fund of knowledge is appropriate.   Cranial Nerves: Pupils equal round and reactive to light.  Facial tone is symmetric.    No posterior lumbar tenderness.   No abnormal lesions on exposed skin.   Strength: Side Biceps Triceps Deltoid Interossei Grip Wrist Ext. Wrist Flex.  R 5 5 5 5 5 5 5   L 5 5 5 5 5 5 5    Side Iliopsoas Quads Hamstring PF DF EHL  R 5 5 5 5 5 5   L 5 5 5 5 5 5    Reflexes are 2+ and symmetric at the biceps, triceps, brachioradialis, patella and achilles.   Hoffman's is absent.  Clonus is not present.   Bilateral upper and lower extremity sensation is intact to light touch.     Gait is normal.     Medical Decision Making  Imaging: None  Assessment and Plan: [name] is a pleasant 57 y.o. female has history of intermittent LBP with flare up of severe pain on 02/23/23.  She is much better than she was, but she still has constant LBP with no leg pain. Pain is more of a stiffness with intermittent sharp pains. No numbness, tingling, weakness in her legs.   She has known lumbar spondylosis and DDD L4-S1. LBP is likely multifactorial.  Treatment options discussed with patient and following plan made:   - Recommend PT for lumbar spine. She goes to her beach house in [facility] for the summer. She was given lumbar HEP for now and will contact me if she finds PT facility in [facility].  - Okay to continue on prn OTC motrin and take as directed on bottle. Take with food.  - Refill given on flexeril to take prn spasms. Reviewed dosing and side effects. Discussed this can cause drowsiness.  - Follow up with me in August when she gets back from the beach.  - If no improvement, may consider lumbar MRI and possible referral for injections.   I spent  a total of 35 minutes in face-to-face and non-face-to-face activities related to this patient's care today including review of outside records, review of imaging, review of symptoms, physical exam, discussion of differential diagnosis, discussion of treatment options, and documentation.   Thank you for involving me in the care of this patient.   Drake Leach PA-C Dept. of Neurosurgery

## 2023-05-08 ENCOUNTER — Ambulatory Visit: Payer: BC Managed Care – PPO | Admitting: Orthopedic Surgery

## 2023-06-01 ENCOUNTER — Ambulatory Visit: Payer: BC Managed Care – PPO | Admitting: Dermatology

## 2023-06-01 ENCOUNTER — Encounter: Payer: Self-pay | Admitting: Dermatology

## 2023-06-01 VITALS — BP 122/74 | HR 71

## 2023-06-01 DIAGNOSIS — L219 Seborrheic dermatitis, unspecified: Secondary | ICD-10-CM

## 2023-06-01 DIAGNOSIS — K130 Diseases of lips: Secondary | ICD-10-CM

## 2023-06-01 MED ORDER — KETOCONAZOLE 2 % EX CREA
TOPICAL_CREAM | CUTANEOUS | 2 refills | Status: DC
Start: 2023-06-01 — End: 2023-08-31

## 2023-06-01 MED ORDER — EUCRISA 2 % EX OINT
TOPICAL_OINTMENT | CUTANEOUS | 2 refills | Status: DC
Start: 2023-06-01 — End: 2023-08-31

## 2023-06-01 NOTE — Progress Notes (Signed)
   Follow Up Visit   Subjective  Claire Ramos is a 57 y.o. female who presents for the following: Rash around nose and left side of mouth. Itches at times. Dur: off and on since mid summer. Used HC in the beginning, no help.  Has been using her father's Metronidazole 0.75% cream, not helping. May have had a similar rash in the past.   The following portions of the chart were reviewed this encounter and updated as appropriate: medications, allergies, medical history  Review of Systems:  No other skin or systemic complaints except as noted in HPI or Assessment and Plan.  Objective  Well appearing patient in no apparent distress; mood and affect are within normal limits.  A focused examination was performed of the following areas: Face  Relevant exam findings are noted in the Assessment and Plan.         Assessment & Plan     SEBORRHEIC DERMATITIS Exam: Pink patches with greasy scale at alar creases bilaterally  Chronic and persistent condition with duration or expected duration over one year. Condition is bothersome/symptomatic for patient. Currently flared.   Seborrheic Dermatitis is a chronic persistent rash characterized by pinkness and scaling most commonly of the mid face but also can occur on the scalp (dandruff), ears; mid chest, mid back and groin.  It tends to be exacerbated by stress and cooler weather.  People who have neurologic disease may experience new onset or exacerbation of existing seborrheic dermatitis.  The condition is not curable but treatable and can be controlled.  Treatment Plan: Start ketoconazole cream  twice a day as needed for rash  Start Eucrisa twice daily to affected areas on face as needed for rash. Driven by malassezia   Angular Cheilitis   Chronic and persistent condition with duration or expected duration over one year. Condition is bothersome/symptomatic for patient. Currently flared.  Exam: erythema with scale at left corner of  mouth  Treatment plan: Start ketoconazole cream  twice a day as needed for rash  Start Eucrisa twice daily to affected areas on face as needed for rash. Driven by oral candida and irritant contact  Return for TBSE As Scheduled.  I, Lawson Radar, CMA, am acting as scribe for Elie Goody, MD.   Documentation: I have reviewed the above documentation for accuracy and completeness, and I agree with the above.  Elie Goody, MD

## 2023-06-01 NOTE — Patient Instructions (Addendum)
Start ketoconazole cream  twice a day as needed for rash  Start Eucrisa twice daily to affected areas on face as needed for rash.    Due to recent changes in healthcare laws, you may see results of your pathology and/or laboratory studies on MyChart before the doctors have had a chance to review them. We understand that in some cases there may be results that are confusing or concerning to you. Please understand that not all results are received at the same time and often the doctors may need to interpret multiple results in order to provide you with the best plan of care or course of treatment. Therefore, we ask that you please give [place] 2 business days to thoroughly review all your results before contacting the office for clarification. Should we see a critical lab result, you will be contacted sooner.   If You Need Anything After Your Visit  If you have any questions or concerns for your doctor, please call our main line at [phone] and press option 4 to reach your doctor's medical assistant. If no one answers, please leave a voicemail as directed and we will return your call as soon as possible. Messages left after 4 pm will be answered the following business day.   You may also send [place] a message via MyChart. We typically respond to MyChart messages within 1-2 business days.  For prescription refills, please ask your pharmacy to contact our office. Our fax number is [phone].  If you have an urgent issue when the clinic is closed that cannot wait until the next business day, you can page your doctor at the number below.    Please note that while we do our best to be available for urgent issues outside of office hours, we are not available 24/7.   If you have an urgent issue and are unable to reach [place], you may choose to seek medical care at your doctor's office, retail clinic, urgent care center, or emergency room.  If you have a medical emergency, please immediately call 911 or go to  the emergency department.  Pager Numbers  - Dr. Gwen Pounds: [phone]  - Dr. Roseanne Reno: [phone]  - Dr. Katrinka Blazing: [phone]   In the event of inclement weather, please call our main line at [phone] for an update on the status of any delays or closures.  Dermatology Medication Tips: Please keep the boxes that topical medications come in in order to help keep track of the instructions about where and how to use these. Pharmacies typically print the medication instructions only on the boxes and not directly on the medication tubes.   If your medication is too expensive, please contact our office at [phone] option 4 or send [place] a message through MyChart.   We are unable to tell what your co-pay for medications will be in advance as this is different depending on your insurance coverage. However, we may be able to find a substitute medication at lower cost or fill out paperwork to get insurance to cover a needed medication.   If a prior authorization is required to get your medication covered by your insurance company, please allow [place] 1-2 business days to complete this process.  Drug prices often vary depending on where the prescription is filled and some pharmacies may offer cheaper prices.  The website www.goodrx.com contains coupons for medications through different pharmacies. The prices here do not account for what the cost may be with help from insurance (it may be cheaper with  your insurance), but the website can give you the price if you did not use any insurance.  - You can print the associated coupon and take it with your prescription to the pharmacy.  - You may also stop by our office during regular business hours and pick up a GoodRx coupon card.  - If you need your prescription sent electronically to a different pharmacy, notify our office through [facility] or by phone at [phone] option 4.     Si Usted Necesita Algo Despus de Su Visita  Tambin  puede enviarnos un mensaje a travs de Clinical cytogeneticist. Por lo general respondemos a los mensajes de MyChart en el transcurso de 1 a 2 das hbiles.  Para renovar recetas, por favor pida a su farmacia que se ponga en contacto con nuestra oficina. Annie Sable de fax es [place] [phone].  Si tiene un asunto urgente cuando la clnica est cerrada y que no puede esperar hasta el siguiente da hbil, puede llamar/localizar a su doctor(a) al nmero que aparece a continuacin.   Por favor, tenga en cuenta que aunque hacemos todo lo posible para estar disponibles para asuntos urgentes fuera del horario de [place], no estamos disponibles las 24 horas del da, los 7 [address]  [address] de la [place].   Si tiene un problema urgente y no puede comunicarse con nosotros, puede optar por buscar atencin mdica  en el consultorio de su doctor(a), en una clnica privada, en un centro de atencin urgente o en una sala de emergencias.  Si tiene Engineer, drilling, por favor llame inmediatamente al 911 o vaya a la sala de emergencias.  Nmeros de bper  - Dr. Gwen Pounds: [phone]  - Dra. Roseanne Reno: [phone]  - Dr. Katrinka Blazing: [phone]   En caso de inclemencias del tiempo, por favor llame a Lacy Duverney principal al [phone] para una actualizacin sobre el [place] de cualquier retraso o cierre.  Consejos para la medicacin en dermatologa: Por favor, guarde las cajas en las que vienen los medicamentos de uso tpico para ayudarle a seguir las instrucciones sobre dnde y cmo usarlos. Las farmacias generalmente imprimen las instrucciones del medicamento slo en las cajas y no directamente en los tubos del [place].   Si su medicamento es muy caro, por favor, pngase en contacto con Rolm Gala llamando al [phone] y presione la opcin 4 o envenos un mensaje a travs de Clinical cytogeneticist.   No podemos decirle cul ser su copago por los medicamentos por adelantado ya que esto es diferente dependiendo de la cobertura de su  seguro. Sin embargo, es posible que podamos encontrar un medicamento sustituto a Audiological scientist un formulario para que el seguro cubra el medicamento que se considera necesario.   Si se requiere una autorizacin previa para que su compaa de seguros [place] su medicamento, por favor permtanos de 1 a 2 das hbiles para completar [address]  Los precios de los medicamentos varan con frecuencia dependiendo del Environmental consultant de dnde se surte la receta y alguna farmacias pueden ofrecer precios ms baratos.  El sitio web www.goodrx.com tiene cupones para medicamentos de Health and safety inspector. Los precios aqu no tienen en cuenta lo que podra costar con la ayuda del seguro (puede ser ms barato con su seguro), pero el sitio web puede darle el precio si no utiliz Tourist information centre manager.  - Puede imprimir el cupn correspondiente y llevarlo con su receta a la farmacia.  - Tambin puede pasar por nuestra oficina durante el horario de atencin regular y Librarian, academic  tarjeta de cupones de GoodRx.  - Si necesita que su receta se enve electrnicamente a una farmacia diferente, informe a nuestra oficina a travs de MyChart de [place] o por telfono llamando al [phone] y presione la opcin 4.

## 2023-07-10 ENCOUNTER — Other Ambulatory Visit: Payer: Self-pay | Admitting: Obstetrics and Gynecology

## 2023-07-10 DIAGNOSIS — Z1231 Encounter for screening mammogram for malignant neoplasm of breast: Secondary | ICD-10-CM

## 2023-08-21 ENCOUNTER — Ambulatory Visit
Admission: RE | Admit: 2023-08-21 | Discharge: 2023-08-21 | Disposition: A | Discharge status: Home / Self Care | Payer: BC Managed Care – PPO | Source: Ambulatory Visit | Attending: Obstetrics and Gynecology | Admitting: Obstetrics and Gynecology

## 2023-08-21 DIAGNOSIS — Z1231 Encounter for screening mammogram for malignant neoplasm of breast: Secondary | ICD-10-CM | POA: Insufficient documentation

## 2023-08-22 ENCOUNTER — Other Ambulatory Visit: Payer: Self-pay | Admitting: Obstetrics and Gynecology

## 2023-08-22 DIAGNOSIS — F419 Anxiety disorder, unspecified: Secondary | ICD-10-CM

## 2023-08-26 ENCOUNTER — Encounter: Payer: Self-pay | Admitting: Obstetrics and Gynecology

## 2023-08-28 ENCOUNTER — Ambulatory Visit: Payer: BC Managed Care – PPO | Admitting: Dermatology

## 2023-08-28 ENCOUNTER — Encounter: Payer: Self-pay | Admitting: Dermatology

## 2023-08-28 DIAGNOSIS — Z86018 Personal history of other benign neoplasm: Secondary | ICD-10-CM

## 2023-08-28 DIAGNOSIS — D1801 Hemangioma of skin and subcutaneous tissue: Secondary | ICD-10-CM

## 2023-08-28 DIAGNOSIS — L309 Dermatitis, unspecified: Secondary | ICD-10-CM

## 2023-08-28 DIAGNOSIS — L710 Perioral dermatitis: Secondary | ICD-10-CM

## 2023-08-28 DIAGNOSIS — Z1283 Encounter for screening for malignant neoplasm of skin: Secondary | ICD-10-CM | POA: Diagnosis not present

## 2023-08-28 DIAGNOSIS — L578 Other skin changes due to chronic exposure to nonionizing radiation: Secondary | ICD-10-CM

## 2023-08-28 DIAGNOSIS — L821 Other seborrheic keratosis: Secondary | ICD-10-CM

## 2023-08-28 DIAGNOSIS — L814 Other melanin hyperpigmentation: Secondary | ICD-10-CM | POA: Diagnosis not present

## 2023-08-28 DIAGNOSIS — D229 Melanocytic nevi, unspecified: Secondary | ICD-10-CM

## 2023-08-28 DIAGNOSIS — D2272 Melanocytic nevi of left lower limb, including hip: Secondary | ICD-10-CM

## 2023-08-28 DIAGNOSIS — W908XXA Exposure to other nonionizing radiation, initial encounter: Secondary | ICD-10-CM

## 2023-08-28 MED ORDER — DOXYCYCLINE MONOHYDRATE 100 MG PO CAPS: 100.0000 mg | ORAL_CAPSULE | Freq: Two times a day (BID) | ORAL | 1 refills | Status: DC

## 2023-08-28 NOTE — Patient Instructions (Addendum)
Start Doxycycline 100 mg twice daily for 2 months  Doxycycline should be taken with food to prevent nausea. Do not lay down for 30 minutes after taking. Be cautious with sun exposure and use good sun protection while on this medication. Pregnant women should not take this medication.    Recommend daily broad spectrum sunscreen SPF 30+ to sun-exposed areas, reapply every 2 hours as needed. Call for new or changing lesions.  Staying in the shade or wearing long sleeves, sun glasses (UVA+UVB protection) and wide brim hats (4-inch brim around the entire circumference of the hat) are also recommended for sun protection.      Melanoma ABCDEs  Melanoma is the most dangerous type of skin cancer, and is the leading cause of death from skin disease.  You are more likely to develop melanoma if you: Have light-colored skin, light-colored eyes, or red or blond hair Spend a lot of time in the sun Tan regularly, either outdoors or in a tanning bed Have had blistering sunburns, especially during childhood Have a close family member who has had a melanoma Have atypical moles or large birthmarks  Early detection of melanoma is key since treatment is typically straightforward and cure rates are extremely high if we catch it early.   The first sign of melanoma is often a change in a mole or a new dark spot.  The ABCDE system is a way of remembering the signs of melanoma.  A for asymmetry:  The two halves do not match. B for border:  The edges of the growth are irregular. C for color:  A mixture of colors are present instead of an even brown color. D for diameter:  Melanomas are usually (but not always) greater than 6mm - the size of a pencil eraser. E for evolution:  The spot keeps changing in size, shape, and color.  Please check your skin once per month between visits. You can use a small mirror in front and a large mirror behind you to keep an eye on the back side or your body.   If you see any new  or changing lesions before your next follow-up, please call to schedule a visit.  Please continue daily skin protection including broad spectrum sunscreen SPF 30+ to sun-exposed areas, reapplying every 2 hours as needed when you're outdoors.   Staying in the shade or wearing long sleeves, sun glasses (UVA+UVB protection) and wide brim hats (4-inch brim around the entire circumference of the hat) are also recommended for sun protection.      Due to recent changes in healthcare laws, you may see results of your pathology and/or laboratory studies on MyChart before the doctors have had a chance to review them. We understand that in some cases there may be results that are confusing or concerning to you. Please understand that not all results are received at the same time and often the doctors may need to interpret multiple results in order to provide you with the best plan of care or course of treatment. Therefore, we ask that you please give [place] 2 business days to thoroughly review all your results before contacting the office for clarification. Should we see a critical lab result, you will be contacted sooner.   If You Need Anything After Your Visit  If you have any questions or concerns for your doctor, please call our main line at [phone] and press option 4 to reach your doctor's medical assistant. If no one answers, please leave a voicemail  as directed and we will return your call as soon as possible. Messages left after 4 pm will be answered the following business day.   You may also send [place] a message via MyChart. We typically respond to MyChart messages within 1-2 business days.  For prescription refills, please ask your pharmacy to contact our office. Our fax number is [phone].  If you have an urgent issue when the clinic is closed that cannot wait until the next business day, you can page your doctor at the number below.    Please note that while we do our best to be available for  urgent issues outside of office hours, we are not available 24/7.   If you have an urgent issue and are unable to reach [place], you may choose to seek medical care at your doctor's office, retail clinic, urgent care center, or emergency room.  If you have a medical emergency, please immediately call 911 or go to the emergency department.  Pager Numbers  - Dr. Gwen Pounds: [phone]  - Dr. Roseanne Reno: [phone]  - Dr. Katrinka Blazing: [phone]   In the event of inclement weather, please call our main line at [phone] for an update on the status of any delays or closures.  Dermatology Medication Tips: Please keep the boxes that topical medications come in in order to help keep track of the instructions about where and how to use these. Pharmacies typically print the medication instructions only on the boxes and not directly on the medication tubes.   If your medication is too expensive, please contact our office at [phone] option 4 or send [place] a message through MyChart.   We are unable to tell what your co-pay for medications will be in advance as this is different depending on your insurance coverage. However, we may be able to find a substitute medication at lower cost or fill out paperwork to get insurance to cover a needed medication.   If a prior authorization is required to get your medication covered by your insurance company, please allow [place] 1-2 business days to complete this process.  Drug prices often vary depending on where the prescription is filled and some pharmacies may offer cheaper prices.  The website www.goodrx.com contains coupons for medications through different pharmacies. The prices here do not account for what the cost may be with help from insurance (it may be cheaper with your insurance), but the website can give you the price if you did not use any insurance.  - You can print the associated coupon and take it with your prescription to the pharmacy.  - You may also  stop by our office during regular business hours and pick up a GoodRx coupon card.  - If you need your prescription sent electronically to a different pharmacy, notify our office through [facility] or by phone at [phone] option 4.     Si Usted Necesita Algo Despus de Su Visita  Tambin puede enviarnos un mensaje a travs de Clinical cytogeneticist. Por lo general respondemos a los mensajes de MyChart en el transcurso de 1 a 2 das hbiles.  Para renovar recetas, por favor pida a su farmacia que se ponga en contacto con nuestra oficina. Annie Sable de fax es [place] [phone].  Si tiene un asunto urgente cuando la clnica est cerrada y que no puede esperar hasta el siguiente da hbil, puede llamar/localizar a su doctor(a) al nmero que aparece a continuacin.   Por favor, tenga en cuenta que aunque hacemos todo lo  posible para estar disponibles para asuntos urgentes fuera del horario de oficina, no estamos disponibles las 24 horas del da, los 7 [address]  [address] de la [place].   Si tiene un problema urgente y no puede comunicarse con nosotros, puede optar por buscar atencin mdica  en el consultorio de su doctor(a), en una clnica privada, en un centro de atencin urgente o en una sala de emergencias.  Si tiene Engineer, drilling, por favor llame inmediatamente al 911 o vaya a la sala de emergencias.  Nmeros de bper  - Dr. Gwen Pounds: [phone]  - Dra. Roseanne Reno: [phone]  - Dr. Katrinka Blazing: [phone]   En caso de inclemencias del tiempo, por favor llame a Lacy Duverney principal al [phone] para una actualizacin sobre el [place] de cualquier retraso o cierre.  Consejos para la medicacin en dermatologa: Por favor, guarde las cajas en las que vienen los medicamentos de uso tpico para ayudarle a seguir las instrucciones sobre dnde y cmo usarlos. Las farmacias generalmente imprimen las instrucciones del medicamento slo en las cajas y no directamente en los tubos del [place].    Si su medicamento es muy caro, por favor, pngase en contacto con Rolm Gala llamando al [phone] y presione la opcin 4 o envenos un mensaje a travs de Clinical cytogeneticist.   No podemos decirle cul ser su copago por los medicamentos por adelantado ya que esto es diferente dependiendo de la cobertura de su seguro. Sin embargo, es posible que podamos encontrar un medicamento sustituto a Audiological scientist un formulario para que el seguro cubra el medicamento que se considera necesario.   Si se requiere una autorizacin previa para que su compaa de seguros [place] su medicamento, por favor permtanos de 1 a 2 das hbiles para completar [address]  Los precios de los medicamentos varan con frecuencia dependiendo del Environmental consultant de dnde se surte la receta y alguna farmacias pueden ofrecer precios ms baratos.  El sitio web www.goodrx.com tiene cupones para medicamentos de Health and safety inspector. Los precios aqu no tienen en cuenta lo que podra costar con la ayuda del seguro (puede ser ms barato con su seguro), pero el sitio web puede darle el precio si no utiliz Tourist information centre manager.  - Puede imprimir el cupn correspondiente y llevarlo con su receta a la farmacia.  - Tambin puede pasar por nuestra oficina durante el horario de atencin regular y Education officer, museum una tarjeta de cupones de GoodRx.  - Si necesita que su receta se enve electrnicamente a una farmacia diferente, informe a nuestra oficina a travs de MyChart de [place] o por telfono llamando al [phone] y presione la opcin 4.

## 2023-08-28 NOTE — Progress Notes (Signed)
Follow-Up Visit   Subjective  Claire Ramos is a 57 y.o. female who presents for the following: Skin Cancer Screening and Full Body Skin Exam. Hx of dysplastic nevus. No personal Hx of skin cancer.    Recheck rash on face.   The patient presents for Total-Body Skin Exam (TBSE) for skin cancer screening and mole check. The patient has spots, moles and lesions to be evaluated, some may be new or changing and the patient may have concern these could be cancer.    The following portions of the chart were reviewed this encounter and updated as appropriate: medications, allergies, medical history  Review of Systems:  No other skin or systemic complaints except as noted in HPI or Assessment and Plan.  Objective  Well appearing patient in no apparent distress; mood and affect are within normal limits.  A full examination was performed including scalp, head, eyes, ears, nose, lips, neck, chest, axillae, abdomen, back, buttocks, bilateral upper extremities, bilateral lower extremities, hands, feet, fingers, toes, fingernails, and toenails. All findings within normal limits unless otherwise noted below.   Relevant physical exam findings are noted in the Assessment and Plan.  perioral Erythematous papules with crusting at right chin/right corner of mouth and alar creases    Assessment & Plan   HISTORY OF DYSPLASTIC NEVUS. Right superior scapula. Moderate to severe atypia, close to margin. 01/10/2018. No evidence of recurrence today Recommend regular full body skin exams Recommend daily broad spectrum sunscreen SPF 30+ to sun-exposed areas, reapply every 2 hours as needed.  Call if any new or changing lesions are noted between office visits   SKIN CANCER SCREENING PERFORMED TODAY.  ACTINIC DAMAGE - Chronic condition, secondary to cumulative UV/sun exposure - diffuse scaly erythematous macules with underlying dyspigmentation - Recommend daily broad spectrum sunscreen SPF 30+ to  sun-exposed areas, reapply every 2 hours as needed.  - Staying in the shade or wearing long sleeves, sun glasses (UVA+UVB protection) and wide brim hats (4-inch brim around the entire circumference of the hat) are also recommended for sun protection.  - Call for new or changing lesions.  LENTIGINES, SEBORRHEIC KERATOSES, HEMANGIOMAS - Benign normal skin lesions - Benign-appearing - Call for any changes  MELANOCYTIC NEVI - Tan-brown and/or pink-flesh-colored symmetric macules and papules - Benign appearing on exam today - Observation - Call clinic for new or changing moles - Recommend daily use of broad spectrum spf 30+ sunscreen to sun-exposed areas.    MELANOCYTIC NEVUS Exam: 7 mm pigmented macule at left popliteal   Treatment Plan: Benign appearing on exam today. Recommend observation. Call clinic for new or changing moles. Recommend daily use of broad spectrum spf 30+ sunscreen to sun-exposed areas.    Angular cheilitis: resolved  Periorificial dermatitis perioral  Periorificial dermatitis  Chronic dermatitis of unclear etiology, flaring, not at patient goal  Start Doxycycline 100 mg twice daily for 2 months. Take full course even if rash clears before finishing to reduce recurrence  Doxycycline should be taken with food to prevent nausea. Do not lay down for 30 minutes after taking. Be cautious with sun exposure and use good sun protection while on this medication. Pregnant women should not take this medication.    Multiple benign nevi  Lentigines  Actinic elastosis  Seborrheic keratoses  Cherry angioma   Return in about 1 year (around 08/27/2024) for TBSE, HxDN.  I, Lawson Radar, CMA, am acting as scribe for Elie Goody, MD.   Documentation: I have reviewed the above documentation  for accuracy and completeness, and I agree with the above.  Elie Goody, MD

## 2023-08-31 ENCOUNTER — Ambulatory Visit (INDEPENDENT_AMBULATORY_CARE_PROVIDER_SITE_OTHER): Payer: BC Managed Care – PPO | Admitting: Obstetrics and Gynecology

## 2023-08-31 ENCOUNTER — Encounter: Payer: Self-pay | Admitting: Obstetrics and Gynecology

## 2023-08-31 ENCOUNTER — Other Ambulatory Visit (HOSPITAL_COMMUNITY)
Admission: RE | Admit: 2023-08-31 | Discharge: 2023-08-31 | Disposition: A | Discharge status: Home / Self Care | Payer: BC Managed Care – PPO | Source: Ambulatory Visit | Attending: Obstetrics and Gynecology | Admitting: Obstetrics and Gynecology

## 2023-08-31 VITALS — BP 106/68 | HR 71 | Ht 66.0 in | Wt 173.0 lb

## 2023-08-31 DIAGNOSIS — R87610 Atypical squamous cells of undetermined significance on cytologic smear of cervix (ASC-US): Secondary | ICD-10-CM

## 2023-08-31 DIAGNOSIS — Z124 Encounter for screening for malignant neoplasm of cervix: Secondary | ICD-10-CM

## 2023-08-31 DIAGNOSIS — Z131 Encounter for screening for diabetes mellitus: Secondary | ICD-10-CM

## 2023-08-31 DIAGNOSIS — Z1151 Encounter for screening for human papillomavirus (HPV): Secondary | ICD-10-CM | POA: Insufficient documentation

## 2023-08-31 DIAGNOSIS — Z1231 Encounter for screening mammogram for malignant neoplasm of breast: Secondary | ICD-10-CM

## 2023-08-31 DIAGNOSIS — N393 Stress incontinence (female) (male): Secondary | ICD-10-CM

## 2023-08-31 DIAGNOSIS — Z0000 Encounter for general adult medical examination without abnormal findings: Secondary | ICD-10-CM

## 2023-08-31 DIAGNOSIS — Z01419 Encounter for gynecological examination (general) (routine) without abnormal findings: Secondary | ICD-10-CM | POA: Diagnosis not present

## 2023-08-31 DIAGNOSIS — F32A Depression, unspecified: Secondary | ICD-10-CM

## 2023-08-31 DIAGNOSIS — Z13220 Encounter for screening for lipoid disorders: Secondary | ICD-10-CM

## 2023-08-31 DIAGNOSIS — N3942 Incontinence without sensory awareness: Secondary | ICD-10-CM

## 2023-08-31 MED ORDER — VENLAFAXINE HCL ER 37.5 MG PO CP24
37.5000 mg | ORAL_CAPSULE | Freq: Every day | ORAL | 0 refills | Status: DC
Start: 2023-08-31 — End: 2023-10-18

## 2023-08-31 MED ORDER — VENLAFAXINE HCL ER 75 MG PO CP24
75.0000 mg | ORAL_CAPSULE | Freq: Every day | ORAL | 1 refills | Status: DC
Start: 2023-08-31 — End: 2023-10-31

## 2023-08-31 NOTE — Progress Notes (Signed)
PCP: Rica Records, PA-C   Chief Complaint  Patient presents with   Gynecologic Exam    Bladder issues: leakage, cannot stop urine stream. Cramps in feet. Weight gain.    HPI:      [name] is a 57 y.o. [phone] whose LMP was No LMP recorded (lmp unknown). Patient is postmenopausal., presents today for her annual examination.  Her menses are absent due to menopause. No PMB.  Has tolerable vasomotor sx.   Sex activity: single partner, contraception - post menopausal status. She does not have vaginal dryness/pain. Has occas drop of blood with sex with wiping   Last Pap: 08/16/22 Results were: ASCUS/neg HPV DNA. Repeat pap due today.  Last mammogram: 08/21/23   Results were: normal--routine follow-up in 12 months. Hx of benign bx for LT breast calcifications 2019.  There is a FH of breast cancer in her M[facility], genetic testing not indicated. There is no FH of ovarian cancer. The patient does do self-breast exams.  Colonoscopy: 2019 with Dr. Servando Snare, no bx done;  Repeat due after 10 years.   Tobacco use: The patient denies current or previous tobacco use. Alcohol use: none No drug use Exercise: min active now  She does get adequate calcium but not Vitamin D in her diet.  Normal labs with [facility] 8/20; due for fasting labs.   Takes lexapro 20 mg for anxiety with sx relief usually but has been under increased stress. Still having anxiety/depression sx this yr but feels "numb" now. Also having issues with binge eating, which is increasing wt. Pt aware she is not hungry, just eating.  Marland Kitchen   Has had issues with urinary incontinence with certain movements (particularly bending over), as well as exercise and SUI. Has to wear pads all day and sometimes overflows them. Doesn't have sensation that she needs to void. No OAB sx/urge incontinence. Can't stop her flow of urine either. Hasn't tried kegel exercises. No UTI sx. Interested in bladder tack surgery. 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 No hx of back/spine trauma.  Also complains of foot cramps past few wks that happen spontaneously with sitting. Has been eating more bananas recently. Trying to stretch feet. Not exercising as much due to bladder leakage.  Past Medical History:  Diagnosis Date   Abnormal mammogram of left breast 10/2017   Birads 4 calcifications   Anxiety    Chronic kidney disease    KIDNEY STONE   Dysplastic nevus 01/10/2018   Right superior scapula. Moderate to severe atypia, close to margin.   Palpitations     Past Surgical History:  Procedure Laterality Date   AUGMENTATION MAMMAPLASTY Bilateral 2003   BREAST BIOPSY Left 11/14/2017   benign   COLONOSCOPY WITH PROPOFOL N/A 04/23/2018   Procedure: COLONOSCOPY WITH PROPOFOL;  Surgeon: Midge Minium, MD;  Location: [facility];  Service: Endoscopy;  Laterality: N/A;   CYSTOSCOPY W/ RETROGRADES Bilateral 06/15/2015   Procedure: CYSTOSCOPY WITH RETROGRADE PYELOGRAM;  Surgeon: Jerilee Field, MD;  Location: [facility];  Service: Urology;  Laterality: Bilateral;   TONSILLECTOMY      Family History  Problem Relation Age of Onset   Kidney cancer Mother 37   Asthma Mother    Diabetes Mother    Hypertension Maternal Grandmother    Osteoarthritis Paternal Grandmother    Breast cancer Other        47s   Kidney disease Neg Hx    Prostate  cancer Neg Hx     Social History   Socioeconomic History   Marital status: Married    Spouse name: Not on file   Number of children: 2   Years of education: Not on file   Highest education level: Not on file  Occupational History   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  Tobacco Use   Smoking status: Never   Smokeless tobacco: Never  Vaping Use   Vaping status: Never Used  Substance and Sexual Activity   Alcohol use: Yes    Alcohol/week: 0.0 standard drinks of alcohol    Comment: socially - may have 1 drink/wk   Drug use: No   Sexual activity: Yes     Birth control/protection: Post-menopausal  Other Topics Concern   Not on file  Social History Narrative   Not on file   Social Determinants of Health   Financial Resource Strain: Not on file  Food Insecurity: Not on file  Transportation Needs: Not on file  Physical Activity: Insufficiently Active (12/05/2017)   Exercise Vital Sign    Days of Exercise per Week: 3 days    Minutes of Exercise per Session: 20 min  Stress: No Stress Concern Present (12/05/2017)   [employer] of Occupational Health - Occupational Stress Questionnaire    Feeling of Stress : Only a little  Social Connections: Moderately Integrated (12/05/2017)   Social Connection and Isolation Panel [NHANES]    Frequency of Communication with Friends and Family: More than three times a week    Frequency of Social Gatherings with Friends and Family: Once a week    Attends Religious Services: More than 4 times per year    Active Member of [employer] or Organizations: No    Attends Banker Meetings: Never    Marital Status: Married  Catering manager Violence: Not At Risk (12/05/2017)   Humiliation, Afraid, Rape, and Kick questionnaire    Fear of Current or Ex-Partner: No    Emotionally Abused: No    Physically Abused: No    Sexually Abused: No     Current Outpatient Medications:    doxycycline (MONODOX) 100 MG capsule, Take 1 capsule (100 mg total) by mouth 2 (two) times daily. Take with food, Disp: 60 capsule, Rfl: 1   venlafaxine XR (EFFEXOR XR) 37.5 MG 24 hr capsule, Take 1 capsule (37.5 mg total) by mouth daily with breakfast., Disp: 7 capsule, Rfl: 0   venlafaxine XR (EFFEXOR-XR) 75 MG 24 hr capsule, Take 1 capsule (75 mg total) by mouth daily., Disp: 30 capsule, Rfl: 1     ROS:  Review of Systems  Constitutional:  Negative for fatigue, fever and unexpected weight change.  Respiratory:  Negative for cough, shortness of breath and wheezing.   Cardiovascular:  Negative for chest pain, palpitations  and leg swelling.  Gastrointestinal:  Negative for blood in stool, constipation, diarrhea, nausea and vomiting.  Endocrine: Negative for cold intolerance, heat intolerance and polyuria.  Genitourinary:  Negative for dyspareunia, dysuria, flank pain, frequency, genital sores, hematuria, menstrual problem, pelvic pain, urgency, vaginal bleeding, vaginal discharge and vaginal pain.  Musculoskeletal:  Negative for arthralgias, back pain, joint swelling and myalgias.  Skin:  Negative for rash.  Neurological:  Negative for dizziness, syncope, light-headedness, numbness and headaches.  Hematological:  Negative for adenopathy.  Psychiatric/Behavioral:  Positive for agitation and dysphoric mood. Negative for confusion, sleep disturbance and suicidal ideas. The patient is not nervous/anxious.    BREAST: No symptoms    Objective: BP 106/68  Pulse 71   Ht 5\' 6"  (1.676 m)   Wt 173 lb (78.5 kg)   LMP  (LMP Unknown)   BMI 27.92 kg/m    Physical Exam Constitutional:      Appearance: She is well-developed.  Genitourinary:     Vulva normal.     Right Labia: No rash, tenderness or lesions.    Left Labia: No tenderness, lesions or rash.    No vaginal discharge, erythema or tenderness.      Right Adnexa: not tender and no mass present.    Left Adnexa: not tender and no mass present.    No cervical friability or polyp.     Uterus is not enlarged or tender.  Breasts:    Right: No mass, nipple discharge, skin change or tenderness.     Left: No mass, nipple discharge, skin change or tenderness.  Neck:     Thyroid: No thyromegaly.  Cardiovascular:     Rate and Rhythm: Normal rate and regular rhythm.     Heart sounds: Normal heart sounds. No murmur heard. Pulmonary:     Effort: Pulmonary effort is normal.     Breath sounds: Normal breath sounds.  Abdominal:     Palpations: Abdomen is soft.     Tenderness: There is no abdominal tenderness. There is no guarding or rebound.  Musculoskeletal:         General: Normal range of motion.     Cervical back: Normal range of motion.  Lymphadenopathy:     Cervical: No cervical adenopathy.  Neurological:     General: No focal deficit present.     Mental Status: She is alert and oriented to person, place, and time.     Cranial Nerves: No cranial nerve deficit.  Skin:    General: Skin is warm and dry.  Psychiatric:        Mood and Affect: Mood normal.        Behavior: Behavior normal.        Thought Content: Thought content normal.        Judgment: Judgment normal.  Vitals reviewed.       08/31/2023    3:40 PM 08/16/2022   10:17 AM  GAD 7 : Generalized Anxiety Score  Nervous, Anxious, on Edge 1 1  Control/stop worrying 1 2  Worry too much - different things 1 2  Trouble relaxing 1 2  Restless 0 0  Easily annoyed or irritable 2 2  Afraid - awful might happen 2 1  Total GAD 7 Score 8 10  Anxiety Difficulty Somewhat difficult Somewhat difficult       08/31/2023    3:40 PM  Depression screen PHQ 2/9  Decreased Interest 1  Down, Depressed, Hopeless 1  PHQ - 2 Score 2  Altered sleeping 1  Tired, decreased energy 2  Change in appetite 2  Feeling bad or failure about yourself  2  Trouble concentrating 1  Moving slowly or fidgety/restless 1  Suicidal thoughts 1  PHQ-9 Score 12  Difficult doing work/chores Somewhat difficult    Assessment/Plan:  Encounter for annual routine gynecological examination  Cervical cancer screening - Plan: Cytology - PAP  Screening for HPV (human papillomavirus) - Plan: Cytology - PAP  ASCUS of cervix with negative high risk HPV - Plan: Cytology - PAP; repeat pap today. Will f/u if abn.   Encounter for screening mammogram for malignant neoplasm of breast; pt current on mammo  Anxiety and depression - Plan: venlafaxine XR (EFFEXOR XR)  37.5 MG 24 hr capsule, venlafaxine XR (EFFEXOR-XR) 75 MG 24 hr capsule; will wean down off lexapro and try effexor. Take 1/2 tab lexapro daily for 7 days, then  change to effexor 37.5 mg for 1 wk then increase to 75 mg daily. Pt to f/u via message in 6-8 wks re: side effects and GAD/PHQ.   SUI (stress urinary incontinence, female) - Plan: Ambulatory referral to Urogynecology, Ambulatory referral to Physical Therapy; refer to urogyn and pelvic PT.   Urinary incontinence without sensory awareness - Plan: Ambulatory referral to Urogynecology, Ambulatory referral to Physical Therapy  Blood tests for routine general physical examination - Plan: Comprehensive metabolic panel, CBC with Differential/Platelet, Lipid panel, Hemoglobin A1c  Screening cholesterol level - Plan: Lipid panel  Screening for diabetes mellitus - Plan: Hemoglobin A1c   Meds ordered this encounter  Medications   venlafaxine XR (EFFEXOR XR) 37.5 MG 24 hr capsule    Sig: Take 1 capsule (37.5 mg total) by mouth daily with breakfast.    Dispense:  7 capsule    Refill:  0    Order Specific Question:   Supervising Provider    Answer:   Hildred Laser [[id number]]   venlafaxine XR (EFFEXOR-XR) 75 MG 24 hr capsule    Sig: Take 1 capsule (75 mg total) by mouth daily.    Dispense:  30 capsule    Refill:  1    Order Specific Question:   Supervising Provider    Answer:   Waymon Budge            GYN counsel breast self exam, mammography screening, menopause, adequate intake of calcium and vitamin D, diet and exercise    F/U  Return in about 1 year (around 08/30/2024).  Reilynn Lauro B. Raeli Wiens, PA-C 08/31/2023 4:26 PM

## 2023-08-31 NOTE — Patient Instructions (Signed)
I value your feedback and you entrusting us with your care. If you get a [facility] patient survey, I would appreciate you taking the time to let us know about your experience today. Thank you! ? ? ?

## 2023-09-05 ENCOUNTER — Other Ambulatory Visit: Payer: BC Managed Care – PPO

## 2023-09-05 DIAGNOSIS — Z131 Encounter for screening for diabetes mellitus: Secondary | ICD-10-CM

## 2023-09-05 DIAGNOSIS — Z13220 Encounter for screening for lipoid disorders: Secondary | ICD-10-CM

## 2023-09-05 DIAGNOSIS — Z0000 Encounter for general adult medical examination without abnormal findings: Secondary | ICD-10-CM

## 2023-09-06 LAB — LIPID PANEL
Chol/HDL Ratio: 3.8 {ratio} (ref 0.0–4.4)
Cholesterol, Total: 182 mg/dL (ref 100–199)
HDL: 48 mg/dL (ref >39)
LDL Chol Calc (NIH): 106 mg/dL — ABNORMAL HIGH (ref 0–99)
Triglycerides: 161 mg/dL — ABNORMAL HIGH (ref 0–149)
VLDL Cholesterol Cal: 28 mg/dL (ref 5–40)

## 2023-09-06 LAB — CBC WITH DIFFERENTIAL/PLATELET
Basophils Absolute: 0.1 10*3/uL (ref 0.0–0.2)
Basos: 2 %
EOS (ABSOLUTE): 0.3 10*3/uL (ref 0.0–0.4)
Eos: 5 %
Hematocrit: 43.4 % (ref 34.0–46.6)
Hemoglobin: 13.7 g/dL (ref 11.1–15.9)
Immature Grans (Abs): 0 10*3/uL (ref 0.0–0.1)
Immature Granulocytes: 0 %
Lymphocytes Absolute: 2.1 10*3/uL (ref 0.7–3.1)
Lymphs: 30 %
MCH: 27.4 pg (ref 26.6–33.0)
MCHC: 31.6 g/dL (ref 31.5–35.7)
MCV: 87 fL (ref 79–97)
Monocytes Absolute: 0.6 10*3/uL (ref 0.1–0.9)
Monocytes: 8 %
Neutrophils Absolute: 3.9 10*3/uL (ref 1.4–7.0)
Neutrophils: 55 %
Platelets: 299 10*3/uL (ref 150–450)
RBC: 5 x10E6/uL (ref 3.77–5.28)
RDW: 13.3 % (ref 11.7–15.4)
WBC: 7 10*3/uL (ref 3.4–10.8)

## 2023-09-06 LAB — CYTOLOGY - PAP
Comment: NEGATIVE
Diagnosis: NEGATIVE
High risk HPV: NEGATIVE

## 2023-09-06 LAB — COMPREHENSIVE METABOLIC PANEL
ALT: 11 [IU]/L (ref 0–32)
AST: 16 [IU]/L (ref 0–40)
Albumin: 4.1 g/dL (ref 3.8–4.9)
Alkaline Phosphatase: 109 [IU]/L (ref 44–121)
BUN/Creatinine Ratio: 16 (ref 9–23)
BUN: 14 mg/dL (ref 6–24)
Bilirubin Total: 0.7 mg/dL (ref 0.0–1.2)
CO2: 27 mmol/L (ref 20–29)
Calcium: 9.2 mg/dL (ref 8.7–10.2)
Chloride: 102 mmol/L (ref 96–106)
Creatinine, Ser: 0.87 mg/dL (ref 0.57–1.00)
Globulin, Total: 3 g/dL (ref 1.5–4.5)
Glucose: 94 mg/dL (ref 70–99)
Potassium: 4.1 mmol/L (ref 3.5–5.2)
Sodium: 143 mmol/L (ref 134–144)
Total Protein: 7.1 g/dL (ref 6.0–8.5)
eGFR: 78 mL/min/{1.73_m2} (ref >59)

## 2023-09-06 LAB — HEMOGLOBIN A1C
Est. average glucose Bld gHb Est-mCnc: 117 mg/dL
Hgb A1c MFr Bld: 5.7 % — ABNORMAL HIGH (ref 4.8–5.6)

## 2023-09-07 ENCOUNTER — Encounter: Payer: Self-pay | Admitting: Obstetrics and Gynecology

## 2023-09-14 ENCOUNTER — Encounter: Payer: BC Managed Care – PPO | Admitting: Dermatology

## 2023-09-26 ENCOUNTER — Other Ambulatory Visit: Payer: Self-pay | Admitting: Obstetrics and Gynecology

## 2023-09-26 DIAGNOSIS — F419 Anxiety disorder, unspecified: Secondary | ICD-10-CM

## 2023-10-18 ENCOUNTER — Encounter: Payer: Self-pay | Admitting: Obstetrics

## 2023-10-18 ENCOUNTER — Other Ambulatory Visit (HOSPITAL_COMMUNITY)
Admission: RE | Admit: 2023-10-18 | Discharge: 2023-10-18 | Disposition: A | Discharge status: Home / Self Care | Payer: 59 | Source: Ambulatory Visit | Attending: Obstetrics | Admitting: Obstetrics

## 2023-10-18 ENCOUNTER — Ambulatory Visit: Payer: 59 | Admitting: Obstetrics

## 2023-10-18 VITALS — BP 128/83 | HR 69 | Ht 64.76 in | Wt 170.2 lb

## 2023-10-18 DIAGNOSIS — N393 Stress incontinence (female) (male): Secondary | ICD-10-CM | POA: Diagnosis not present

## 2023-10-18 DIAGNOSIS — R319 Hematuria, unspecified: Secondary | ICD-10-CM

## 2023-10-18 DIAGNOSIS — R8290 Unspecified abnormal findings in urine: Secondary | ICD-10-CM | POA: Insufficient documentation

## 2023-10-18 DIAGNOSIS — K5900 Constipation, unspecified: Secondary | ICD-10-CM | POA: Diagnosis not present

## 2023-10-18 DIAGNOSIS — N952 Postmenopausal atrophic vaginitis: Secondary | ICD-10-CM | POA: Insufficient documentation

## 2023-10-18 DIAGNOSIS — R82998 Other abnormal findings in urine: Secondary | ICD-10-CM | POA: Diagnosis present

## 2023-10-18 DIAGNOSIS — R350 Frequency of micturition: Secondary | ICD-10-CM | POA: Diagnosis present

## 2023-10-18 LAB — URINALYSIS, ROUTINE W REFLEX MICROSCOPIC
Bacteria, UA: NONE SEEN
Bilirubin Urine: NEGATIVE
Glucose, UA: NEGATIVE mg/dL
Ketones, ur: NEGATIVE mg/dL
Leukocytes,Ua: NEGATIVE
Nitrite: NEGATIVE
Protein, ur: NEGATIVE mg/dL
Specific Gravity, Urine: 1.025 (ref 1.005–1.030)
pH: 5 (ref 5.0–8.0)

## 2023-10-18 LAB — POCT URINALYSIS DIPSTICK
Bilirubin, UA: NEGATIVE
Glucose, UA: NEGATIVE
Nitrite, UA: NEGATIVE
Protein, UA: NEGATIVE
Spec Grav, UA: 1.025 (ref 1.010–1.025)
Urobilinogen, UA: 0.2 U/dL
pH, UA: 5.5 (ref 5.0–8.0)

## 2023-10-18 MED ORDER — ESTRADIOL 0.1 MG/GM VA CREA
0.5000 g | TOPICAL_CREAM | VAGINAL | 2 refills | Status: DC
Start: 2023-10-19 — End: 2024-06-03

## 2023-10-18 NOTE — Progress Notes (Signed)
New Patient Evaluation and Consultation  Referring Provider: Rica Records, PA-C PCP: Trenda Moots Date of Service: 10/18/2023  SUBJECTIVE Chief Complaint: New Patient (Initial Visit) (Claire Ramos is a 58 y.o. female here today for urinary incontinence. )  History of Present Illness: Claire Ramos is a 58 y.o. White or Caucasian female seen in consultation at the request of PA Copland for evaluation of urinary leakage.    Urinary leakage started 2 years ago, worsened around 1 year ago Denies changes at onset of symptoms. 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 or playing tug of war with children Tried Kegels at home Reports binge eating with increasing weight Started Effexor for hot flashes with relief, prior use of lexapro without relief Hot flashes started 4 years ago Denies vaginal dryness however reports occasional spotting postcoitally after prolonged periods of not having intercourse Family history of breast cancer  Review of records significant for: CKD with Cr 0.87 on 09/05/23, ED visit for acute low back pain 02/23/23  Urinary Symptoms: Leaks urine with cough/ sneeze, laughing, exercise, lifting, and with a full bladder Leaks 5-6 time(s) per days with activity Denies leakage with urgency Pad use:  1-2  pads per day started 1 year ago Patient is bothered by UI symptoms.  Day time voids 5-6.  Nocturia: 1 times per night to void. Voiding dysfunction:  empties bladder well.  Patient does not use a catheter to empty bladder.  When urinating, patient feels dribbling after finishing Drinks: 36oz water per day, 1 cup of coffee, 1 glass 1/2 and 1/2 tea at dinner  UTIs:  0  UTI's in the last year.   Denies history of pyelonephritis, bladder cancer, and kidney cancer History of kidney stones with cystoscopy and bilateral retrograde pyelogram by Dr. Mena Goes in 2016 with non-obstructing 4mm left renal stone with  hematuria, denies blood in urine since treatment of kidney stone No results found for the last 90 days.   Pelvic Organ Prolapse Symptoms:                  Patient Denies a feeling of a bulge the vaginal area.   Bowel Symptom: Bowel movements: 1 time(s) per 2 days Stool consistency: soft  Straining: yes every 2 weeks Splinting: no.  Incomplete evacuation: yes.  Patient Denies accidental bowel leakage / fecal incontinence Bowel regimen: fiber with irregular use Last colonoscopy: Results normal [facility] Colonoscopy          Upcoming     Colonoscopy (Every 10 Years) Next due on 04/23/2028    04/23/2018  COLONOSCOPY   Only the first 1 history entries have been loaded, but more history exists.                Sexual Function Sexually active: yes.  Sexual orientation: Straight Pain with sex: No  Pelvic Pain Denies pelvic pain   Past Medical History:  Past Medical History:  Diagnosis Date   Abnormal mammogram of left breast 10/2017   Birads 4 calcifications   Anxiety    Chronic kidney disease    KIDNEY STONE   Dysplastic nevus 01/10/2018   Right superior scapula. Moderate to severe atypia, close to margin.   Palpitations      Past Surgical History:   Past Surgical History:  Procedure Laterality Date   AUGMENTATION MAMMAPLASTY Bilateral 2003   BREAST BIOPSY Left 11/14/2017   benign   COLONOSCOPY WITH PROPOFOL N/A 04/23/2018  Procedure: COLONOSCOPY WITH PROPOFOL;  Surgeon: Midge Minium, MD;  Location: [facility];  Service: Endoscopy;  Laterality: N/A;   CYSTOSCOPY W/ RETROGRADES Bilateral 06/15/2015   Procedure: CYSTOSCOPY WITH RETROGRADE PYELOGRAM;  Surgeon: Jerilee Field, MD;  Location: [facility];  Service: Urology;  Laterality: Bilateral;   TONSILLECTOMY       Past OB/GYN History: OB History  Gravida Para Term Preterm AB Living  2 2 2   2   SAB IAB Ectopic Multiple Live Births      2    # Outcome Date GA Lbr Len/2nd Weight Sex Type Anes PTL Lv   2 Term 09/04/01 [redacted]w[redacted]d  10 lb 6 oz (4.706 kg) M Vag-Spont   LIV  1 Term 05/04/98   8 lb 1 oz (3.657 kg) F Vag-Vacuum   LIV     Complications: Fetal tachycardia, delivered, Maternal exhaustion complicating labor and delivery    Obstetric Comments  Partial third degree lac/MLE    Vaginal deliveries: 2, largest infant 10.5lb with 3rd laceration.  Forceps/ Vacuum deliveries: 1, Cesarean section: 0 Menopausal: Yes, at age 43, Denies vaginal bleeding since menopause Contraception: s/p menopause. Last pap smear.  Any history of abnormal pap smears: yes. Denies CKC or LEEP    Component Value Date/Time   DIAGPAP  08/31/2023 1509    - Negative for intraepithelial lesion or malignancy (NILM)   DIAGPAP (A) 08/16/2022 0954    - Atypical squamous cells of undetermined significance (ASC-US)   HPVHIGH Negative 08/31/2023 1509   HPVHIGH Negative 08/16/2022 0954   ADEQPAP  08/31/2023 1509    Satisfactory for evaluation; transformation zone component PRESENT.   ADEQPAP  08/16/2022 0954    Satisfactory for evaluation; transformation zone component PRESENT.    Medications: Patient has a current medication list which includes the following prescription(s): doxycycline, [START ON 10/19/2023] estradiol, prednisone, and venlafaxine xr.   Allergies: Patient has no known allergies.   Social History:  Social History   Tobacco Use   Smoking status: Never   Smokeless tobacco: Never  Vaping Use   Vaping status: Never Used  Substance Use Topics   Alcohol use: Yes    Alcohol/week: 0.0 standard drinks of alcohol    Comment: socially - may have 1 drink/wk   Drug use: No    Relationship status: married Patient lives with her husband and children.   Patient is employed as a Runner, broadcasting/film/video. Regular exercise: No History of abuse: No  Family History:   Family History  Problem Relation Age of Onset   Kidney cancer Mother 39   Asthma Mother    Diabetes Mother    Hypertension Maternal Grandmother     Osteoarthritis Paternal Grandmother    Breast cancer Other        66s   Kidney disease Neg Hx    Prostate cancer Neg Hx    Bladder Cancer Neg Hx    Ovarian cancer Neg Hx    Uterine cancer Neg Hx      Review of Systems: Review of Systems  Constitutional:  Negative for fever, malaise/fatigue and weight loss.       Weight gain  Respiratory:  Positive for cough. Negative for shortness of breath and wheezing.   Cardiovascular:  Negative for chest pain, palpitations and leg swelling.  Gastrointestinal:  Positive for constipation. Negative for abdominal pain and blood in stool.  Genitourinary:  Negative for dysuria, frequency, hematuria and urgency.       Leakage  Skin:  Negative for rash.  Neurological:  Negative for dizziness, weakness and headaches.  Endo/Heme/Allergies:  Does not bruise/bleed easily.       Hot flashes  Psychiatric/Behavioral:  Negative for depression. The patient is not nervous/anxious.      OBJECTIVE Physical Exam: Vitals:   10/18/23 0921  BP: 128/83  Pulse: 69  Weight: 170 lb 3.2 oz (77.2 kg)  Height: 5' 4.76" (1.645 m)    Physical Exam Constitutional:      General: She is not in acute distress.    Appearance: Normal appearance.  Genitourinary:     Bladder and urethral meatus normal.     No lesions in the vagina.     Right Labia: No rash, tenderness, lesions, skin changes or Bartholin's cyst.    Left Labia: No tenderness, lesions, skin changes, Bartholin's cyst or rash.    No vaginal discharge, erythema, tenderness, bleeding, ulceration or granulation tissue.     Anterior vaginal prolapse present.    Mild vaginal atrophy present.     Right Adnexa: not tender, not full and no mass present.    Left Adnexa: not tender, not full and no mass present.    No cervical motion tenderness, discharge, friability, lesion, polyp or nabothian cyst.     Uterus is not enlarged, fixed, tender or irregular.     No uterine mass detected.    Urethral meatus caruncle  not present.    No urethral prolapse, tenderness, mass, hypermobility, discharge or stress urinary incontinence with cough stress test (10mL in bladder) present.     Bladder is not tender, urgency on palpation not present and masses not present.      Pelvic Floor: Levator muscle strength is 3/5.    Levator ani not tender, obturator internus not tender, no asymmetrical contractions present and no pelvic spasms present.    Symmetrical pelvic sensation, anal wink present and BC reflex present. Cardiovascular:     Rate and Rhythm: Normal rate.  Pulmonary:     Effort: Pulmonary effort is normal. No respiratory distress.  Abdominal:     General: There is no distension.     Palpations: Abdomen is soft. There is no mass.     Tenderness: There is no abdominal tenderness.     Hernia: No hernia is present.  Neurological:     Mental Status: She is alert.  Vitals reviewed. Exam conducted with a chaperone present.      POP-Q:   POP-Q  -1                                            Aa   -1                                           Ba  -6                                              C   2  Gh  2                                            Pb  9                                            tvl   -2                                            Ap  -2                                            Bp  -7                                              D    Post-Void Residual (PVR) by Bladder Scan: In order to evaluate bladder emptying, we discussed obtaining a postvoid residual and patient agreed to this procedure.  Procedure: The ultrasound unit was placed on the patient's abdomen in the suprapubic region after the patient had voided.    Post Void Residual - 10/18/23 0934       Post Void Residual   Post Void Residual 7 mL            Straight Catheterization Procedure for PVR: After verbal consent was obtained from the patient for  catheterization to assess bladder emptying and residual volume the urethra and surrounding tissues were prepped with betadine and an in and out catheterization was performed.  PVR was 10mL.  Urine appeared clear yellow. The patient tolerated the procedure well.    Laboratory Results: Lab Results  Component Value Date   COLORU yellow 10/18/2023   CLARITYU clear 10/18/2023   GLUCOSEUR Negative 10/18/2023   BILIRUBINUR Negative 10/18/2023   KETONESU Trace 10/18/2023   SPECGRAV 1.025 10/18/2023   RBCUR Large 10/18/2023   PHUR 5.5 10/18/2023   PROTEINUR Negative 10/18/2023   UROBILINOGEN 0.2 10/18/2023   LEUKOCYTESUR Trace (A) 10/18/2023    Lab Results  Component Value Date   CREATININE 0.87 09/05/2023   CREATININE 0.79 06/11/2015    Lab Results  Component Value Date   HGBA1C 5.7 (H) 09/05/2023    Lab Results  Component Value Date   HGB 13.7 09/05/2023     ASSESSMENT AND PLAN [name] is a 58 y.o. with:  1. SUI (stress urinary incontinence, female)   2. Abnormal urinalysis   3. Vaginal atrophy   4. Constipation, unspecified constipation type   5. Urinary frequency   6. Leukocytes in urine   7. Hematuria, unspecified type     SUI (stress urinary incontinence, female) Assessment & Plan: - POCT clean catch + leuk/heme, catheterized urine + heme/ trace leuk, pending UA micro - PVR 10mL - onset since weight gain, however avoids exercise due to leakage - For treatment of stress urinary incontinence,  non-surgical options include expectant management, weight loss, physical therapy,  as well as a pessary.  Surgical options include a midurethral sling, Burch urethropexy, and transurethral injection of a bulking agent. - reviewed instructions for Kegel exercises - start low dose vaginal estrogen - declines pessary trial - we reviewed office procedure with urethral bulking (Bulkamid). We discussed success rate of approximately 70-80% and possible need for second injection.  We reviewed that this is not a permanent procedure and the Bulkamid does dissolve over time. Risks reviewed including injury to bladder or urethra, UTI, urinary retention and hematuria.  - Sling: The effectiveness of a midurethral vaginal mesh sling is approximately 85%, and thus, there will be times when you may leak urine after surgery, especially if your bladder is full or if you have a strong cough. There is a balance between making the sling tight enough to treat your leakage but not too tight so that you have long-term difficulty emptying your bladder. A mesh sling will not directly treat overactive bladder/urge incontinence and may worsen it.  There is an FDA safety notification on vaginal mesh procedures for prolapse but NOT mesh slings. We have extensive experience and training with mesh placement and we have close postoperative follow up to identify any potential complications from mesh. It is important to realize that this mesh is a permanent implant that cannot be easily removed. There are rare risks of mesh exposure (2-4%), pain with intercourse (0-7%), and infection (<1%). The risk of mesh exposure if more likely in a woman with risks for poor healing (prior radiation, poorly controlled diabetes, or immunocompromised). The risk of new or worsened chronic pain after mesh implant is more common in women with baseline chronic pain and/or poorly controlled anxiety or depression. Approximately 2-4% of patients will experience longer-term post-operative voiding dysfunction that may require surgical revision of the sling. We also reviewed that postoperatively, her stream may not be as strong as before surgery.  - pt desires to review options and callback with decision  Orders: -     Estradiol; Place 0.5 g vaginally 2 (two) times a week. Place 0.5g nightly for two weeks then twice a week after  Dispense: 30 g; Refill: 2  Abnormal urinalysis Assessment & Plan: - POCT clean catch + heme, catheterized  urine + heme, pending UA micro (limited by volume) - clean catch sample urine culture pending - start low dose vaginal estrogen - history of gross hematuria during kidney stone, denies gross hematuria since 2016 - no tobacco use or pelvic radiation history - For management of microscopic hematuria, we discussed the importance of work-up including assessing the upper and lower GU tract with CT urogram and cystoscopy. Cr 0.87 on 09/05/23. She will pursue this work-up and follow-up afterward to discuss the results and decide on a treatment plan based on the findings.   Orders: -     Estradiol; Place 0.5 g vaginally 2 (two) times a week. Place 0.5g nightly for two weeks then twice a week after  Dispense: 30 g; Refill: 2  Vaginal atrophy Assessment & Plan: - For symptomatic vaginal atrophy options include lubrication with a water-based lubricant, personal hygiene measures and barrier protection against wetness, and estrogen replacement in the form of vaginal cream, vaginal tablets, or a time-released vaginal ring.   - Rx to start low dose vaginal estrogen  Orders: -     Estradiol; Place 0.5 g vaginally 2 (two) times a week. Place 0.5g nightly for two weeks then twice a week after  Dispense: 30 g; Refill: 2  Constipation,  unspecified constipation type Assessment & Plan: For constipation, we reviewed the importance of a better bowel regimen.  We also discussed the importance of avoiding chronic straining, as it can exacerbate her pelvic floor symptoms; we discussed treating constipation and straining prior to surgery, as postoperative straining can lead to damage to the repair and recurrence of symptoms. We discussed initiating therapy with increasing fluid intake, fiber supplementation, stool softeners, and laxatives such as miralax.     Urinary frequency -     POCT urinalysis dipstick -     Urine Culture; Future -     Urine Microscopic; Future  Leukocytes in urine -     Urine Culture;  Future  Hematuria, unspecified type -     Urine Microscopic; Future  Time spent: I spent 66 minutes dedicated to the care of this patient on the date of this encounter to include pre-visit review of records, face-to-face time with the patient discussing stress urinary incontinence, constipation, vaginal atrophy, abnormal urinalysis, and post visit documentation and ordering medication/ testing.   Loleta Chance, MD

## 2023-10-18 NOTE — Assessment & Plan Note (Addendum)
-   POCT clean catch + heme, catheterized urine + heme, pending UA micro (limited by volume) - clean catch sample urine culture pending - start low dose vaginal estrogen - history of gross hematuria during kidney stone, denies gross hematuria since 2016 - no tobacco use or pelvic radiation history - For management of microscopic hematuria, we discussed the importance of work-up including assessing the upper and lower GU tract with CT urogram and cystoscopy. Cr 0.87 on 09/05/23. She will pursue this work-up and follow-up afterward to discuss the results and decide on a treatment plan based on the findings.

## 2023-10-18 NOTE — Assessment & Plan Note (Signed)
 For constipation, we reviewed the importance of a better bowel regimen.  We also discussed the importance of avoiding chronic straining, as it can exacerbate her pelvic floor symptoms; we discussed treating constipation and straining prior to surgery, as postoperative straining can lead to damage to the repair and recurrence of symptoms. We discussed initiating therapy with increasing fluid intake, fiber supplementation, stool softeners, and laxatives such as miralax.

## 2023-10-18 NOTE — Patient Instructions (Addendum)
For treatment of stress urinary incontinence, which is leakage with physical activity/movement/strainging/coughing, we discussed expectant management versus nonsurgical options versus surgery. Nonsurgical options include weight loss, physical therapy, as well as a pessary.  Surgical options include a midurethral sling, which is a synthetic mesh sling that acts like a hammock under the urethra to prevent leakage of urine, a Burch urethropexy, and transurethral injection of a bulking agent.   We discussed an office procedure with urethral bulking (Bulkamid). We discussed success rate of approximately 70-80% and possible need for second injection. We reviewed that this is not a permanent procedure and the Bulkamid does dissolve over time. Risks reviewed including injury to bladder or urethra, UTI, urinary retention and hematuria.   Sling: The effectiveness of a midurethral vaginal mesh sling is approximately 85%, and thus, there will be times when you may leak urine after surgery, especially if your bladder is full or if you have a strong cough. There is a balance between making the sling tight enough to treat your leakage but not too tight so that you have long-term difficulty emptying your bladder. A mesh sling will not directly treat overactive bladder/urge incontinence and may worsen it.  There is an FDA safety notification on vaginal mesh procedures for prolapse but NOT mesh slings. We have extensive experience and training with mesh placement and we have close postoperative follow up to identify any potential complications from mesh. It is important to realize that this mesh is a permanent implant that cannot be easily removed. There are rare risks of mesh exposure (2-4%), pain with intercourse (0-7%), and infection (<1%). The risk of mesh exposure if more likely in a woman with risks for poor healing (prior radiation, poorly controlled diabetes, or immunocompromised). The risk of new or worsened chronic  pain after mesh implant is more common in women with baseline chronic pain and/or poorly controlled anxiety or depression. Approximately 2-4% of patients will experience longer-term post-operative voiding dysfunction that may require surgical revision of the sling. We also reviewed that postoperatively, her stream may not be as strong as before surgery.    For symptomatic vaginal atrophy options include lubrication with a water-based lubricant, personal hygiene measures and barrier protection against wetness, and estrogen replacement in the form of vaginal cream, vaginal tablets, or a time-released vaginal ring.     For vaginal atrophy (thinning of the vaginal tissue that can cause dryness and burning) and UTI prevention we discussed estrogen replacement in the form of vaginal cream.   Start vaginal estrogen therapy nightly for two weeks then 2 times weekly at night. This can be placed with your finger or an applicator inside the vagina and around the urethra.  Please let us know if the prescription is too expensive and we can look for alternative options.   Is vaginal estrogen therapy safe for me? Vaginal estrogen preparations act on the vaginal skin, and only a very tiny amount is absorbed into the bloodstream (0.01%).  They work in a similar way to hand or face cream.  There is minimal absorption and they are therefore perfectly safe. If you have had breast cancer and have persistent troublesome symptoms which aren't settling with vaginal moisturisers and lubricants, local estrogen treatment may be a possibility, but consultation with your oncologist should take place first.   Constipation: Our goal is to achieve formed bowel movements daily or every-other-day.  You may need to try different combinations of the following options to find what works best for you - everybody's  body works differently so feel free to adjust the dosages as needed.  Some options to help maintain bowel health  include:  Dietary changes (more leafy greens, vegetables and fruits; less processed foods) Fiber supplementation (Benefiber, FiberCon, Metamucil or Psyllium). Start slow and increase gradually to full dose. Over-the-counter agents such as: stool softeners (Docusate or Colace) and/or laxatives (Miralax, milk of magnesia)  "Power Pudding" is a natural mixture that may help your constipation.  To make blend 1 cup applesauce, 1 cup wheat bran, and 3/4 cup prune juice, refrigerate and then take 1 tablespoon daily with a large glass of water as needed.   Women should try to eat at least 21 to 25 grams of fiber a day, while men should aim for 30 to 38 grams a day. You can add fiber to your diet with food or a fiber supplement such as psyllium (metamucil), benefiber, or fibercon.   Here's a look at how much dietary fiber is found in some common foods. When buying packaged foods, check the Nutrition Facts label for fiber content. It can vary among brands.  Fruits Serving size Total fiber (grams)*  Raspberries 1 cup 8.0  Pear 1 medium 5.5  Apple, with skin 1 medium 4.5  Banana 1 medium 3.0  Orange 1 medium 3.0  Strawberries 1 cup 3.0   Vegetables Serving size Total fiber (grams)*  Green peas, boiled 1 cup 9.0  Broccoli, boiled 1 cup chopped 5.0  Turnip greens, boiled 1 cup 5.0  Brussels sprouts, boiled 1 cup 4.0  Potato, with skin, baked 1 medium 4.0  Sweet corn, boiled 1 cup 3.5  Cauliflower, raw 1 cup chopped 2.0  Carrot, raw 1 medium 1.5   Grains Serving size Total fiber (grams)*  Spaghetti, whole-wheat, cooked 1 cup 6.0  Barley, pearled, cooked 1 cup 6.0  Bran flakes 3/4 cup 5.5  Quinoa, cooked 1 cup 5.0  Oat bran muffin 1 medium 5.0  Oatmeal, instant, cooked 1 cup 5.0  Popcorn, air-popped 3 cups 3.5  Brown rice, cooked 1 cup 3.5  Bread, whole-wheat 1 slice 2.0  Bread, rye 1 slice 2.0   Legumes, nuts and seeds Serving size Total fiber (grams)*  Split peas, boiled 1 cup 16.0   Lentils, boiled 1 cup 15.5  Black beans, boiled 1 cup 15.0  Baked beans, canned 1 cup 10.0  Chia seeds 1 ounce 10.0  Almonds 1 ounce (23 nuts) 3.5  Pistachios 1 ounce (49 nuts) 3.0  Sunflower kernels 1 ounce 3.0  *Rounded to nearest 0.5 gram. Source: [employer] for [employer], [employer]	[place]

## 2023-10-18 NOTE — Assessment & Plan Note (Addendum)
-   POCT clean catch + leuk/heme, catheterized urine + heme/ trace leuk, pending UA micro - PVR 10mL - onset since weight gain, however avoids exercise due to leakage - For treatment of stress urinary incontinence,  non-surgical options include expectant management, weight loss, physical therapy, as well as a pessary.  Surgical options include a midurethral sling, Burch urethropexy, and transurethral injection of a bulking agent. - reviewed instructions for Kegel exercises - start low dose vaginal estrogen - declines pessary trial - we reviewed office procedure with urethral bulking (Bulkamid). We discussed success rate of approximately 70-80% and possible need for second injection. We reviewed that this is not a permanent procedure and the Bulkamid does dissolve over time. Risks reviewed including injury to bladder or urethra, UTI, urinary retention and hematuria.  - Sling: The effectiveness of a midurethral vaginal mesh sling is approximately 85%, and thus, there will be times when you may leak urine after surgery, especially if your bladder is full or if you have a strong cough. There is a balance between making the sling tight enough to treat your leakage but not too tight so that you have long-term difficulty emptying your bladder. A mesh sling will not directly treat overactive bladder/urge incontinence and may worsen it.  There is an FDA safety notification on vaginal mesh procedures for prolapse but NOT mesh slings. We have extensive experience and training with mesh placement and we have close postoperative follow up to identify any potential complications from mesh. It is important to realize that this mesh is a permanent implant that cannot be easily removed. There are rare risks of mesh exposure (2-4%), pain with intercourse (0-7%), and infection (<1%). The risk of mesh exposure if more likely in a woman with risks for poor healing (prior radiation, poorly controlled diabetes, or  immunocompromised). The risk of new or worsened chronic pain after mesh implant is more common in women with baseline chronic pain and/or poorly controlled anxiety or depression. Approximately 2-4% of patients will experience longer-term post-operative voiding dysfunction that may require surgical revision of the sling. We also reviewed that postoperatively, her stream may not be as strong as before surgery.  - pt desires to review options and callback with decision

## 2023-10-18 NOTE — Assessment & Plan Note (Signed)
-   For symptomatic vaginal atrophy options include lubrication with a water-based lubricant, personal hygiene measures and barrier protection against wetness, and estrogen replacement in the form of vaginal cream, vaginal tablets, or a time-released vaginal ring.   - Rx to start low dose vaginal estrogen

## 2023-10-19 LAB — URINE CULTURE: Culture: NO GROWTH

## 2023-10-28 ENCOUNTER — Other Ambulatory Visit: Payer: Self-pay | Admitting: Obstetrics and Gynecology

## 2023-10-28 DIAGNOSIS — F419 Anxiety disorder, unspecified: Secondary | ICD-10-CM

## 2023-10-31 ENCOUNTER — Encounter: Payer: Self-pay | Admitting: Obstetrics and Gynecology

## 2023-10-31 ENCOUNTER — Other Ambulatory Visit: Payer: Self-pay | Admitting: Obstetrics and Gynecology

## 2023-10-31 DIAGNOSIS — F32A Depression, unspecified: Secondary | ICD-10-CM

## 2023-10-31 MED ORDER — VENLAFAXINE HCL ER 75 MG PO CP24: 75.0000 mg | ORAL_CAPSULE | Freq: Every day | ORAL | 3 refills | Status: AC

## 2023-10-31 NOTE — Progress Notes (Signed)
Rx RF effexor 75 mg. Doing well.

## 2023-10-31 NOTE — Telephone Encounter (Signed)
Pls call pt to update GAD/PHQ and I'll send in RF. Thx.

## 2023-11-18 ENCOUNTER — Other Ambulatory Visit: Payer: Self-pay | Admitting: Obstetrics and Gynecology

## 2023-11-18 DIAGNOSIS — F419 Anxiety disorder, unspecified: Secondary | ICD-10-CM

## 2024-06-03 ENCOUNTER — Encounter: Payer: Self-pay | Admitting: Family Medicine

## 2024-06-03 ENCOUNTER — Ambulatory Visit: Admitting: Family Medicine

## 2024-06-03 VITALS — BP 104/69 | HR 82 | Temp 98.0°F | Ht 65.0 in | Wt 143.4 lb

## 2024-06-03 DIAGNOSIS — F5104 Psychophysiologic insomnia: Secondary | ICD-10-CM

## 2024-06-03 DIAGNOSIS — F411 Generalized anxiety disorder: Secondary | ICD-10-CM

## 2024-06-03 DIAGNOSIS — N951 Menopausal and female climacteric states: Secondary | ICD-10-CM | POA: Diagnosis not present

## 2024-06-03 DIAGNOSIS — Z6823 Body mass index (BMI) 23.0-23.9, adult: Secondary | ICD-10-CM | POA: Diagnosis not present

## 2024-06-03 DIAGNOSIS — Z23 Encounter for immunization: Secondary | ICD-10-CM | POA: Diagnosis not present

## 2024-06-03 DIAGNOSIS — R5382 Chronic fatigue, unspecified: Secondary | ICD-10-CM | POA: Diagnosis not present

## 2024-06-03 DIAGNOSIS — R7309 Other abnormal glucose: Secondary | ICD-10-CM

## 2024-06-03 DIAGNOSIS — Z7689 Persons encountering health services in other specified circumstances: Secondary | ICD-10-CM

## 2024-06-03 NOTE — Progress Notes (Signed)
 New Patient Office Visit  Introduced to nurse practitioner role and practice setting.  All questions answered.  Discussed provider/patient relationship and expectations.  Subjective    Patient ID: Claire Ramos, female    DOB: Feb 01, 1966  Age: 58 y.o. MRN: [id number]  CC:  Chief Complaint  Patient presents with   New Patient (Initial Visit)    Establishing care with new provider.  Not sleeping very well, last GYN visit she was told that she was prediabetic and was wondering if she could get lab work done.  Tdap- yes Shingles- yes  All other vaccines declined.   HPI  Discussed the use of AI scribe software for clinical note transcription with the patient, who gave verbal consent to proceed.  Likes to walk History of Present Illness Claire Ramos is a 58 year old female who presents to establish primary care does have some concerns with insomnia previous A1c showed prediabetes.   She experiences difficulty maintaining deep sleep, despite falling asleep easily around 10 PM. She often wakes up during the night and occasionally gets up to urinate. No racing thoughts or anxiety are identified as causes for her sleep issues. She maintains a regular sleep schedule, aiming for 7-8 hours of sleep per night, and avoids screen time before bed and in bed.  She is experiencing postmenopausal symptoms, including hot flashes and sleep disturbances. She has not tried any specific treatments for these symptoms and denies any postmenopausal bleeding.  Her past medical history includes anxiety, for which she is currently taking Effexor . She has lost weight nearly 30ls in past year and is concerned about maintaining her health to avoid becoming diabetic.  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 She enjoys gardening, walking, shopping, and is planning to get a personal trainer soon to strengthen muscles.   Flowsheet Row Office Visit from 06/03/2024 in [facility]  PHQ-9 Total Score 4       06/03/2024    8:36 AM 08/31/2023    3:40 PM 08/16/2022   10:17 AM  GAD 7 : Generalized Anxiety Score  Nervous, Anxious, on Edge 1 1 1   Control/stop worrying 1 1 2   Worry too much - different things 1 1 2   Trouble relaxing 1 1 2   Restless 1 0 0  Easily annoyed or irritable 1 2 2   Afraid - awful might happen 0 2 1  Total GAD 7 Score 6 8 10   Anxiety Difficulty Somewhat difficult Somewhat difficult Somewhat difficult     Outpatient Encounter Medications as of 06/03/2024  Medication Sig   venlafaxine  XR (EFFEXOR -XR) 75 MG 24 hr capsule Take 1 capsule (75 mg total) by mouth daily.   [DISCONTINUED] doxycycline  (MONODOX ) 100 MG capsule Take 1 capsule (100 mg total) by mouth 2 (two) times daily. Take with food   [DISCONTINUED] estradiol  (ESTRACE ) 0.1 MG/GM vaginal cream Place 0.5 g vaginally 2 (two) times a week. Place 0.5g nightly for two weeks then twice a week after   [DISCONTINUED] predniSONE (STERAPRED UNI-PAK 21 TAB) 10 MG (21) TBPK tablet Take by mouth.   No facility-administered encounter medications on file as of 06/03/2024.    Past Medical History:  Diagnosis Date   Abnormal mammogram of left breast 10/2017   Birads 4 calcifications   Anxiety    Chronic kidney disease    KIDNEY STONE   Dysplastic nevus 01/10/2018   Right superior scapula. Moderate to severe atypia, close to  margin.   Palpitations     Past Surgical History:  Procedure Laterality Date   AUGMENTATION MAMMAPLASTY Bilateral 2003   BREAST BIOPSY Left 11/14/2017   benign   COLONOSCOPY WITH PROPOFOL  N/A 04/23/2018   Procedure: COLONOSCOPY WITH PROPOFOL ;  Surgeon: Jinny Carmine, MD;  Location: [facility];  Service: Endoscopy;  Laterality: N/A;   CYSTOSCOPY W/ RETROGRADES Bilateral 06/15/2015   Procedure: CYSTOSCOPY WITH RETROGRADE PYELOGRAM;  Surgeon: Donnice Brooks, MD;  Location: [facility];  Service: Urology;  Laterality: Bilateral;    TONSILLECTOMY      Family History  Problem Relation Age of Onset   Kidney cancer Mother 69   Asthma Mother    Diabetes Mother    Diabetes Father    Hypertension Father    Hypertension Maternal Grandmother    Osteoarthritis Paternal Grandmother    Breast cancer Other        74s   Kidney disease Neg Hx    Prostate cancer Neg Hx    Bladder Cancer Neg Hx    Ovarian cancer Neg Hx    Uterine cancer Neg Hx     Social History   Socioeconomic History   Marital status: Married    Spouse name: Not on file   Number of children: 2   Years of education: Not on file   Highest education level: Not on file  Occupational History   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  Tobacco Use   Smoking status: Never   Smokeless tobacco: Never  Vaping Use   Vaping status: Never Used  Substance and Sexual Activity   Alcohol use: Yes    Alcohol/week: 0.0 standard drinks of alcohol    Comment: socially - may have 1 drink/wk   Drug use: No   Sexual activity: Yes    Birth control/protection: Post-menopausal  Other Topics Concern   Not on file  Social History Narrative   Not on file   Social Drivers of Health   Financial Resource Strain: Not on file  Food Insecurity: Not on file  Transportation Needs: Not on file  Physical Activity: Insufficiently Active (12/05/2017)   Exercise Vital Sign    Days of Exercise per Week: 3 days    Minutes of Exercise per Session: 20 min  Stress: No Stress Concern Present (12/05/2017)   [employer] of Occupational Health - Occupational Stress Questionnaire    Feeling of Stress : Only a little  Social Connections: Moderately Integrated (12/05/2017)   Social Connection and Isolation Panel    Frequency of Communication with Friends and Family: More than three times a week    Frequency of Social Gatherings with Friends and Family: Once a week    Attends Religious Services: More than 4 times per year    Active Member of [employer] or Organizations: No    Attends  Banker Meetings: Never    Marital Status: Married  Catering manager Violence: Not At Risk (12/05/2017)   Humiliation, Afraid, Rape, and Kick questionnaire    Fear of Current or Ex-Partner: No    Emotionally Abused: No    Physically Abused: No    Sexually Abused: No    ROS      Objective    BP 104/69 (BP Location: Left Arm, Patient Position: Sitting, Cuff Size: Normal)   Pulse 82   Temp 98 F (36.7 C) (Oral)   Ht 5' 5 (1.651 m)   Wt 143 lb 6.4 oz (65 kg)   LMP  (LMP Unknown)   SpO2 100%  BMI 23.86 kg/m   Physical Exam Constitutional:      General: She is not in acute distress.    Appearance: Normal appearance. She is normal weight. She is not ill-appearing, toxic-appearing or diaphoretic.  HENT:     Head: Normocephalic.     Right Ear: Hearing, ear canal and external ear normal. Tympanic membrane is scarred.     Left Ear: Hearing, tympanic membrane, ear canal and external ear normal.     Nose: Nose normal. No congestion or rhinorrhea.     Mouth/Throat:     Mouth: Mucous membranes are moist.     Pharynx: Oropharynx is clear. No oropharyngeal exudate or posterior oropharyngeal erythema.  Eyes:     Extraocular Movements: Extraocular movements intact.     Pupils: Pupils are equal, round, and reactive to light.  Neck:     Vascular: No carotid bruit.  Cardiovascular:     Rate and Rhythm: Normal rate and regular rhythm.     Pulses: Normal pulses.     Heart sounds: Normal heart sounds. No murmur heard.    No friction rub. No gallop.  Pulmonary:     Effort: No respiratory distress.     Breath sounds: No stridor. No wheezing, rhonchi or rales.  Chest:     Chest wall: No tenderness.  Abdominal:     General: Abdomen is flat. Bowel sounds are normal. There is no distension.     Palpations: Abdomen is soft. There is no mass.     Tenderness: There is no abdominal tenderness. There is no guarding or rebound.     Hernia: No hernia is present.  Musculoskeletal:      Cervical back: No rigidity or tenderness.     Right lower leg: No edema.     Left lower leg: No edema.  Lymphadenopathy:     Cervical: No cervical adenopathy.  Skin:    General: Skin is warm and dry.     Capillary Refill: Capillary refill takes less than 2 seconds.  Neurological:     General: No focal deficit present.     Mental Status: She is alert and oriented to person, place, and time. Mental status is at baseline.     Cranial Nerves: No cranial nerve deficit.     Motor: No weakness.     Gait: Gait normal.  Psychiatric:        Mood and Affect: Mood normal.        Behavior: Behavior normal.        Thought Content: Thought content normal.        Judgment: Judgment normal.         Assessment & Plan:  Assessment and Plan Assessment & Plan Postmenopausal symptoms  -hot flashes and sleep disturbances.  - Gabapentin was discussed as option, as can have sedating effect as well to help with insomnia- will hold for now  Insomnia and fatigue Insomnia characterized by difficulty maintaining deep sleep, despite falling asleep easily. Potential contributing factors include postmenopausal changes, stress, and subconscious concerns. Sleep hygiene appears adequate, but stress and life transitions may be impacting sleep quality. - Order lab work to assess for underlying causes of insomnia - TSH, CMP, CBC, Vitamin D , Vitamin B12, a1c - Recommend maintaining good sleep hygiene and schedule - Suggest trying physical activity like working out to improve sleep quality - discussed OTC - melatonin or unasom - pt states hard to wake up with these meds. - Consider cognitive behavioral therapy  Generalized Anxiety Disorder  Anxiety disorder managed with Effexor  75mg  daily.  Current dose appears effective.  Anxiety may be contributing to sleep disturbances and is potentially exacerbated by life stressors and relationship dynamics. - Continue current Effexor  regimen - Consider cognitive  behavioral therapy if anxiety symptoms worsen - pt agreeable will reach if she would like a referral to [facility] at [facility] as discussed.   Elevated a1c Previous A1c = 5.7% November 2024 She has been proactive in managing weight and exercising to prevent progression to diabetes.  Lost nearly 30lbs since November 2024  - great work! Blood pressure has also improved since previous visit - Will recheck A1c and lipid - Continue current lifestyle modifications including weight management and exercise  BMI - 23.86 - continue great work on exercise, well balanced diet and life style changes  General Health Maintenance/ New to [facility] Routine health maintenance is up to date.  Mammogram was done in November, and colonoscopy is due in 2029.  Pap smear is due in 2029. 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  Psychophysiological insomnia  Chronic fatigue -     CBC -     Comprehensive metabolic panel with GFR -     Vitamin B12 -     VITAMIN D  25 Hydroxy (Vit-D Deficiency, Fractures) -     TSH -     Lipid panel  Vasomotor symptoms due to menopause  BMI 23.0-23.9, adult  GAD (generalized anxiety disorder)  Elevated hemoglobin A1c -     Hemoglobin A1c  Need for shingles vaccine -     Varicella-zoster vaccine IM  Need for Tdap vaccination -     Tdap vaccine greater than or equal to 7yo IM  Encounter to establish care with new doctor    Return in about 2 months (around 08/03/2024) for annual physical.   I, Curtis DELENA Boom, FNP, have reviewed all documentation for this visit. The documentation on 06/03/24 for the exam, diagnosis, procedures, and orders are all accurate and complete.   Curtis DELENA Boom, FNP

## 2024-06-04 ENCOUNTER — Ambulatory Visit: Payer: Self-pay | Admitting: Family Medicine

## 2024-06-04 LAB — LIPID PANEL
Chol/HDL Ratio: 3.6 ratio (ref 0.0–4.4)
Cholesterol, Total: 185 mg/dL (ref 100–199)
HDL: 52 mg/dL (ref >39)
LDL Chol Calc (NIH): 114 mg/dL — ABNORMAL HIGH (ref 0–99)
Triglycerides: 103 mg/dL (ref 0–149)
VLDL Cholesterol Cal: 19 mg/dL (ref 5–40)

## 2024-06-04 LAB — HEMOGLOBIN A1C
Est. average glucose Bld gHb Est-mCnc: 111 mg/dL
Hgb A1c MFr Bld: 5.5 % (ref 4.8–5.6)

## 2024-06-04 LAB — CBC
Hematocrit: 43.8 % (ref 34.0–46.6)
Hemoglobin: 13.9 g/dL (ref 11.1–15.9)
MCH: 27.6 pg (ref 26.6–33.0)
MCHC: 31.7 g/dL (ref 31.5–35.7)
MCV: 87 fL (ref 79–97)
Platelets: 294 x10E3/uL (ref 150–450)
RBC: 5.03 x10E6/uL (ref 3.77–5.28)
RDW: 13.8 % (ref 11.7–15.4)
WBC: 7.4 x10E3/uL (ref 3.4–10.8)

## 2024-06-04 LAB — COMPREHENSIVE METABOLIC PANEL WITH GFR
ALT: 10 IU/L (ref 0–32)
AST: 17 IU/L (ref 0–40)
Albumin: 4.6 g/dL (ref 3.8–4.9)
Alkaline Phosphatase: 101 IU/L (ref 44–121)
BUN/Creatinine Ratio: 23 (ref 9–23)
BUN: 18 mg/dL (ref 6–24)
Bilirubin Total: 0.6 mg/dL (ref 0.0–1.2)
CO2: 24 mmol/L (ref 20–29)
Calcium: 9.7 mg/dL (ref 8.7–10.2)
Chloride: 102 mmol/L (ref 96–106)
Creatinine, Ser: 0.78 mg/dL (ref 0.57–1.00)
Globulin, Total: 2.8 g/dL (ref 1.5–4.5)
Glucose: 88 mg/dL (ref 70–99)
Potassium: 4.9 mmol/L (ref 3.5–5.2)
Sodium: 141 mmol/L (ref 134–144)
Total Protein: 7.4 g/dL (ref 6.0–8.5)
eGFR: 89 mL/min/1.73 (ref >59)

## 2024-06-04 LAB — VITAMIN D 25 HYDROXY (VIT D DEFICIENCY, FRACTURES): Vit D, 25-Hydroxy: 48.2 ng/mL (ref 30.0–100.0)

## 2024-06-04 LAB — VITAMIN B12: Vitamin B-12: 1255 pg/mL — ABNORMAL HIGH (ref 232–1245)

## 2024-06-04 LAB — TSH: TSH: 1.03 u[IU]/mL (ref 0.450–4.500)

## 2024-08-01 ENCOUNTER — Other Ambulatory Visit: Payer: Self-pay | Admitting: Obstetrics and Gynecology

## 2024-08-01 DIAGNOSIS — Z1231 Encounter for screening mammogram for malignant neoplasm of breast: Secondary | ICD-10-CM

## 2024-08-05 ENCOUNTER — Ambulatory Visit: Admitting: Family Medicine

## 2024-08-08 ENCOUNTER — Telehealth: Payer: Self-pay

## 2024-08-08 NOTE — Telephone Encounter (Unsigned)
 Copied from CRM #[id number]. Topic: Clinical - Medication Question >> Aug 08, 2024  4:57 PM Hadassah PARAS wrote: Reason for CRM: Pt has a 2nd dose shingles injection scheduled for tomorrow. She is also currently taking antibiotics for UTI and is wanting to know if it is okay to continue with shot. Please advise [phone]

## 2024-08-09 ENCOUNTER — Ambulatory Visit: Admitting: Family Medicine

## 2024-08-09 NOTE — Telephone Encounter (Signed)
 She can get the vaccine while on antibiotics, but it is best to get vaccines when you aren't having an infection of some sort. If she is feeling okay and nearly done with antibiotics okay. But I would recommend waiting until she feels 100%.

## 2024-08-09 NOTE — Telephone Encounter (Signed)
 Patient advised.

## 2024-08-27 ENCOUNTER — Ambulatory Visit: Payer: BC Managed Care – PPO | Admitting: Dermatology

## 2024-09-10 ENCOUNTER — Ambulatory Visit
Admission: RE | Admit: 2024-09-10 | Discharge: 2024-09-10 | Disposition: A | Source: Ambulatory Visit | Attending: Obstetrics and Gynecology

## 2024-09-10 DIAGNOSIS — Z1231 Encounter for screening mammogram for malignant neoplasm of breast: Secondary | ICD-10-CM

## 2024-09-13 ENCOUNTER — Ambulatory Visit: Payer: Self-pay | Admitting: Obstetrics and Gynecology

## 2024-09-16 ENCOUNTER — Encounter: Payer: Self-pay | Admitting: Dermatology

## 2024-09-16 ENCOUNTER — Ambulatory Visit: Admitting: Dermatology

## 2024-09-16 DIAGNOSIS — D489 Neoplasm of uncertain behavior, unspecified: Secondary | ICD-10-CM

## 2024-09-16 DIAGNOSIS — D229 Melanocytic nevi, unspecified: Secondary | ICD-10-CM

## 2024-09-16 DIAGNOSIS — D1801 Hemangioma of skin and subcutaneous tissue: Secondary | ICD-10-CM | POA: Diagnosis not present

## 2024-09-16 DIAGNOSIS — L821 Other seborrheic keratosis: Secondary | ICD-10-CM | POA: Diagnosis not present

## 2024-09-16 DIAGNOSIS — W908XXA Exposure to other nonionizing radiation, initial encounter: Secondary | ICD-10-CM | POA: Diagnosis not present

## 2024-09-16 DIAGNOSIS — L814 Other melanin hyperpigmentation: Secondary | ICD-10-CM | POA: Diagnosis not present

## 2024-09-16 DIAGNOSIS — Z1283 Encounter for screening for malignant neoplasm of skin: Secondary | ICD-10-CM | POA: Diagnosis not present

## 2024-09-16 DIAGNOSIS — L578 Other skin changes due to chronic exposure to nonionizing radiation: Secondary | ICD-10-CM | POA: Diagnosis not present

## 2024-09-16 NOTE — Patient Instructions (Addendum)

## 2024-09-16 NOTE — Progress Notes (Signed)
 "  Follow-Up Visit   Subjective  Claire Ramos is a 58 y.o. female who presents for the following: Skin Cancer Screening and Full Body Skin Exam Patient states she wants to focus on the back of her hands. Sun spots on her face and would like to consult treatment. She has a spot on the back of her left calf she wants to get looked at.  The patient presents for Total-Body Skin Exam (TBSE) for skin cancer screening and mole check. The patient has spots, moles and lesions to be evaluated, some may be new or changing and the patient may have concern these could be cancer.    The following portions of the chart were reviewed this encounter and updated as appropriate: medications, allergies, medical history  Review of Systems:  No other skin or systemic complaints except as noted in HPI or Assessment and Plan.  Objective  Well appearing patient in no apparent distress; mood and affect are within normal limits.  A full examination was performed including scalp, head, eyes, ears, nose, lips, neck, chest, axillae, abdomen, back, buttocks, bilateral upper extremities, bilateral lower extremities, hands, feet, fingers, toes, fingernails, and toenails. All findings within normal limits unless otherwise noted below.   Relevant physical exam findings are noted in the Assessment and Plan.  Exam of nails limited by presence of nail polish.  Left Popliteal Fossa 7 mm brown macule   Assessment & Plan   SKIN CANCER SCREENING PERFORMED TODAY.  ACTINIC DAMAGE - Chronic condition, secondary to cumulative UV/sun exposure - diffuse scaly erythematous macules with underlying dyspigmentation - Recommend daily broad spectrum sunscreen SPF 30+ to sun-exposed areas, reapply every 2 hours as needed.  - Staying in the shade or wearing long sleeves, sun glasses (UVA+UVB protection) and wide brim hats (4-inch brim around the entire circumference of the hat) are also recommended for sun protection.  - Call for new  or changing lesions.  LENTIGINES, SEBORRHEIC KERATOSES, HEMANGIOMAS - Benign normal skin lesions - Benign-appearing - Call for any changes - cryo on lentigines can cause hypopigmentation. Recommend cosmetic derm Dr Sherre for treatment options if desired  MELANOCYTIC NEVI - Tan-brown and/or pink-flesh-colored symmetric macules and papules - Benign appearing on exam today - Observation - Call clinic for new or changing moles - Recommend daily use of broad spectrum spf 30+ sunscreen to sun-exposed areas.   NEOPLASM OF UNCERTAIN BEHAVIOR Left Popliteal Fossa - Epidermal / dermal shaving  Lesion diameter (cm):  0.7 Informed consent: discussed and consent obtained   Timeout: patient name, date of birth, surgical site, and procedure verified   Procedure prep:  Patient was prepped and draped in usual sterile fashion Prep type:  Isopropyl alcohol Anesthesia: the lesion was anesthetized in a standard fashion   Anesthetic:  1% lidocaine  w/ epinephrine 1-100,000 buffered w/ 8.4% NaHCO3 Instrument used: DermaBlade   Hemostasis achieved with: pressure and aluminum chloride   Outcome: patient tolerated procedure well   Post-procedure details: wound care instructions given    Specimen 1 - Surgical pathology Differential Diagnosis: Sk vs dysplastic  Check Margins: No 7 mm brown macule ACTINIC ELASTOSIS   SEBORRHEIC KERATOSES   MULTIPLE BENIGN NEVI   LENTIGINES   CHERRY ANGIOMA   Return in about 1 year (around 09/16/2025) for TBSE, w/ Dr. Claudene.  IAlmetta Nora, RMA, am acting as scribe for Boneta Claudene, MD .   Documentation: I have reviewed the above documentation for accuracy and completeness, and I agree with the above.  Collin-Jamal  Claudene, MD    "

## 2024-09-18 LAB — SURGICAL PATHOLOGY

## 2024-09-23 ENCOUNTER — Ambulatory Visit: Payer: Self-pay | Admitting: Dermatology

## 2024-11-01 ENCOUNTER — Other Ambulatory Visit: Payer: Self-pay | Admitting: Obstetrics and Gynecology

## 2024-11-01 DIAGNOSIS — F32A Depression, unspecified: Secondary | ICD-10-CM

## 2025-10-06 ENCOUNTER — Encounter: Admitting: Dermatology
